# Patient Record
Sex: Female | Born: 1985 | Race: Black or African American | Hispanic: No | Marital: Single | Smoking: Never smoker
Health system: Southern US, Community
[De-identification: ages and names within clinical notes are randomized; demographics above are authoritative.]

## PROBLEM LIST (undated history)

## (undated) DIAGNOSIS — E079 Disorder of thyroid, unspecified: Secondary | ICD-10-CM

## (undated) DIAGNOSIS — C801 Malignant (primary) neoplasm, unspecified: Secondary | ICD-10-CM

## (undated) DIAGNOSIS — J45909 Unspecified asthma, uncomplicated: Secondary | ICD-10-CM

---
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
## 2005-03-12 ENCOUNTER — Ambulatory Visit (HOSPITAL_COMMUNITY): Admission: RE | Admit: 2005-03-12 | Discharge: 2005-03-12 | Payer: Self-pay | Admitting: *Deleted

## 2005-04-30 ENCOUNTER — Ambulatory Visit (HOSPITAL_COMMUNITY): Admission: AD | Admit: 2005-04-30 | Discharge: 2005-04-30 | Payer: Self-pay | Admitting: *Deleted

## 2005-08-19 ENCOUNTER — Ambulatory Visit: Payer: Self-pay | Admitting: *Deleted

## 2005-08-19 ENCOUNTER — Inpatient Hospital Stay (HOSPITAL_COMMUNITY): Admission: AD | Admit: 2005-08-19 | Discharge: 2005-08-23 | Payer: Self-pay | Admitting: *Deleted

## 2005-08-20 ENCOUNTER — Encounter (INDEPENDENT_AMBULATORY_CARE_PROVIDER_SITE_OTHER): Payer: Self-pay | Admitting: Specialist

## 2005-08-27 ENCOUNTER — Inpatient Hospital Stay (HOSPITAL_COMMUNITY): Admission: AD | Admit: 2005-08-27 | Discharge: 2005-08-27 | Payer: Self-pay | Admitting: Obstetrics and Gynecology

## 2005-10-26 IMAGING — CT CT PELVIS W/ CM
1 of 3 series · 14 of 32 positions shown, 19 images · IV contrast (OMNI 300 100)
Comparison: none

CLINICAL DATA: 18 year old with upper quadrant abdominal pain and pelvic pain with amenorrhea. 
CT ABDOMEN AND PELVIS WITH CONTRAST:
Helical CT examination of the abdomen and pelvis was performed after bolus infusion of a total of 100 cc of Omnipaque 300 and the use of diluted oral contrast. 
The lung bases are clear.  
CT ABDOMEN:
The liver, spleen, pancreas, adrenal glands and kidneys demonstrate no significant abnormalities.   The gallbladder appears normal.   The stomach is fairly well distended with contrast and demonstrates no gross abnormalities. The duodenum, small bowel and colon are unremarkable.   No mesenteric or retroperitoneal masses or adenopathy.   The aorta is normal in caliber.

[Series 2: abd pelvis · axial · 0.67mm/px · z∈[-407,-22]mm · 14 of 85 slices shown, 19 images]
[im 5/85  soft-tissue]
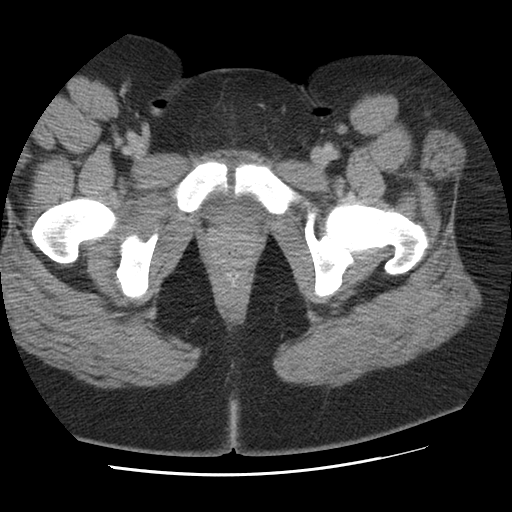
[im 5/85  bone]
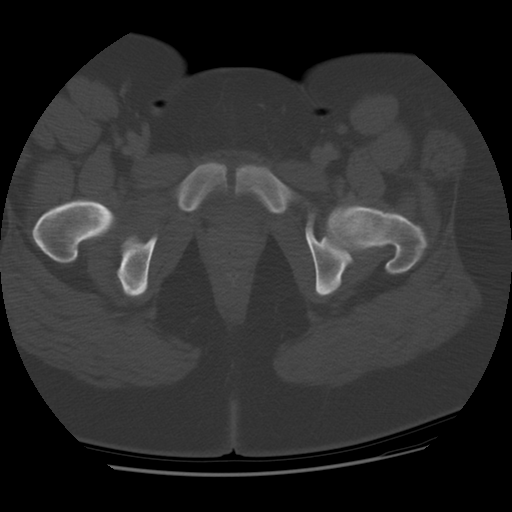
[im 13/85  soft-tissue]
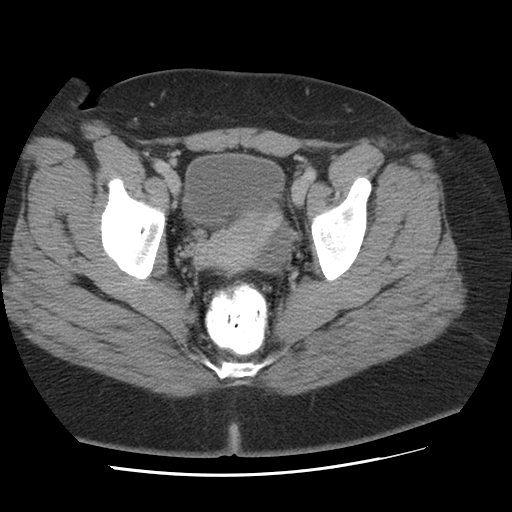
[im 17/85  soft-tissue]
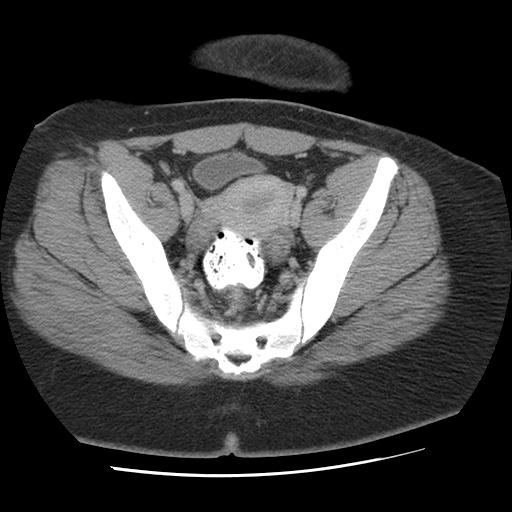
[im 26/85  soft-tissue]
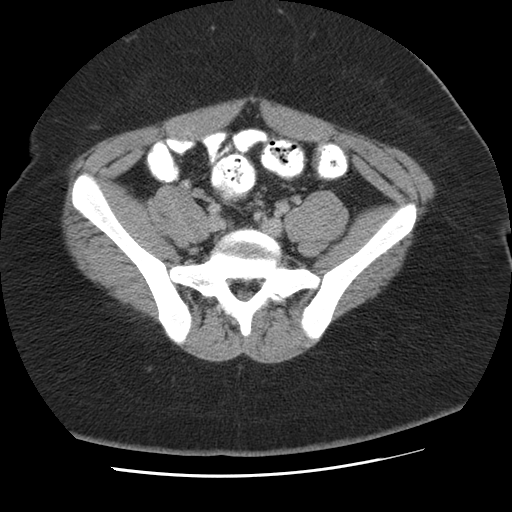
[im 30/85  soft-tissue]
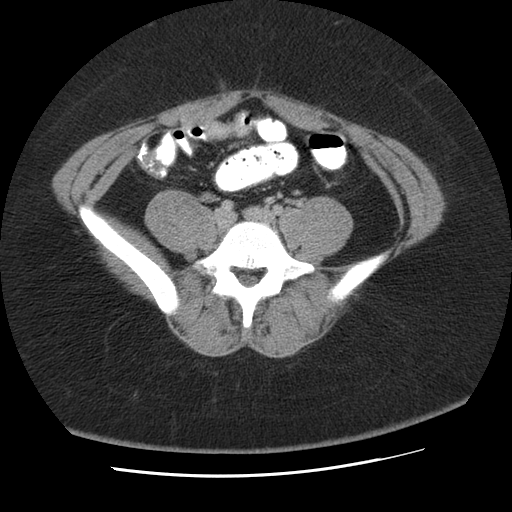
[im 38/85  soft-tissue]
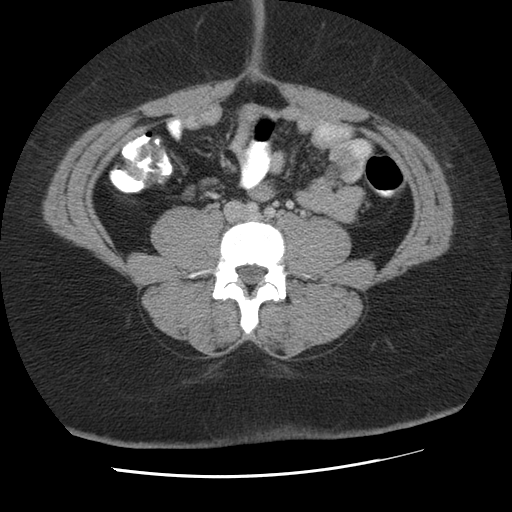
[im 43/85  soft-tissue]
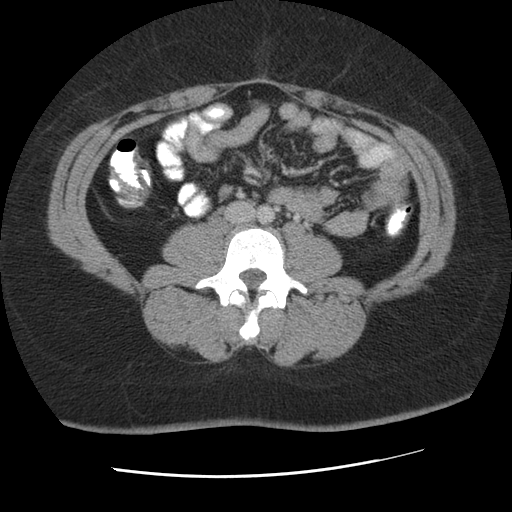
[im 47/85  soft-tissue]
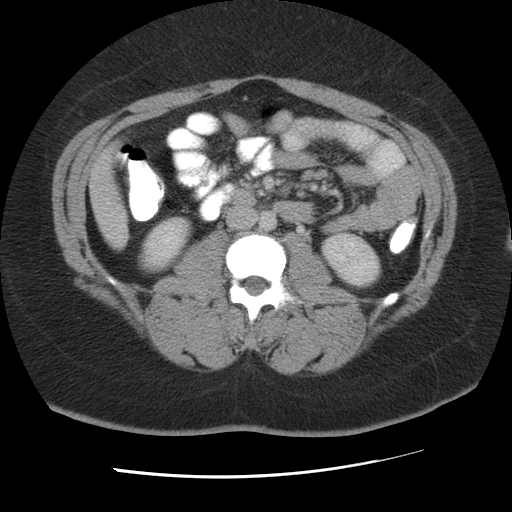
[im 55/85  soft-tissue]
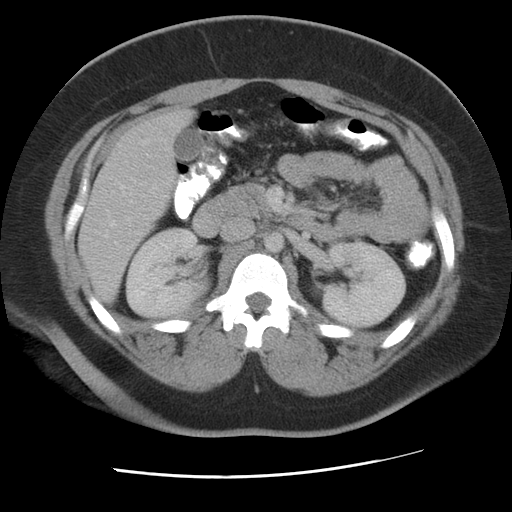
[im 55/85  bone]
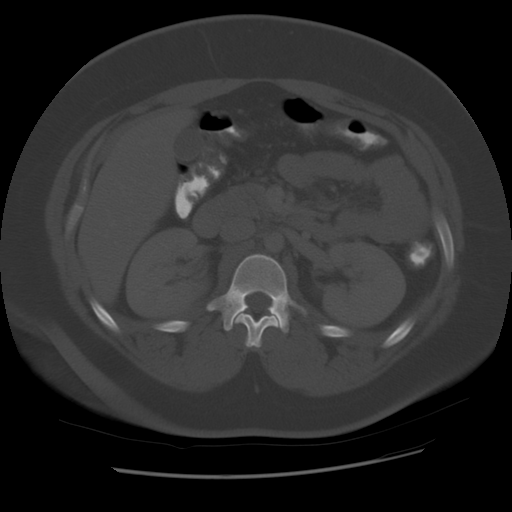
[im 59/85  soft-tissue]
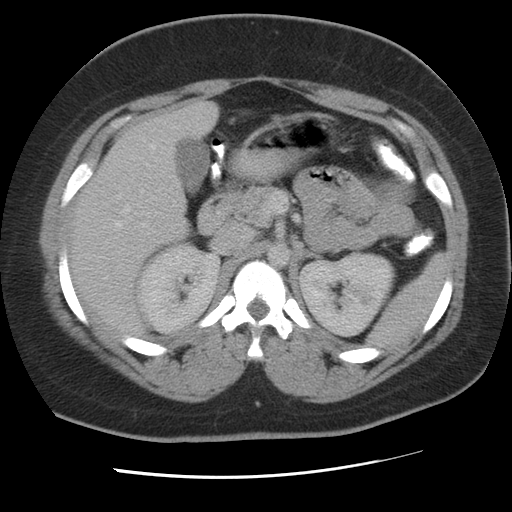
[im 68/85  soft-tissue]
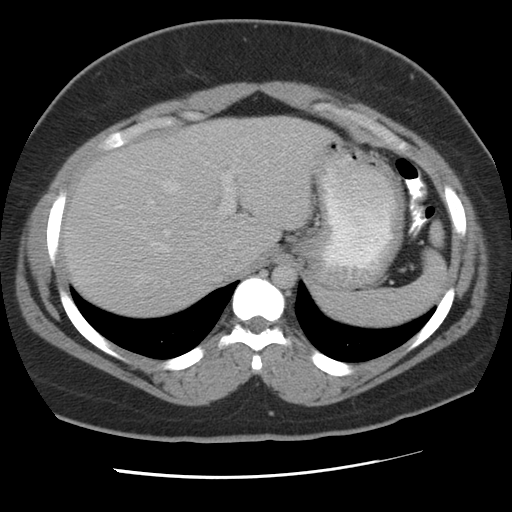
[im 68/85  lung]
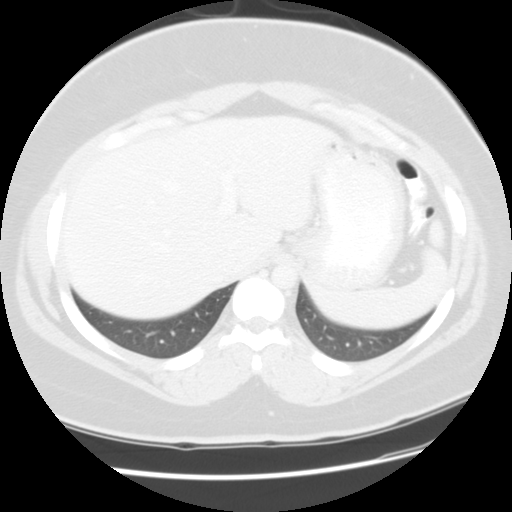
[im 72/85  soft-tissue]
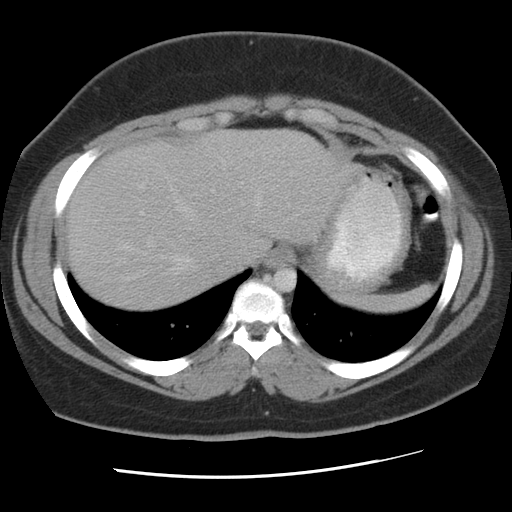
[im 72/85  lung]
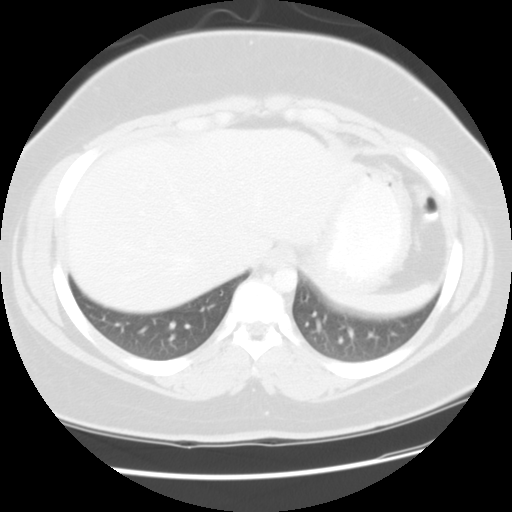
[im 76/85  lung]
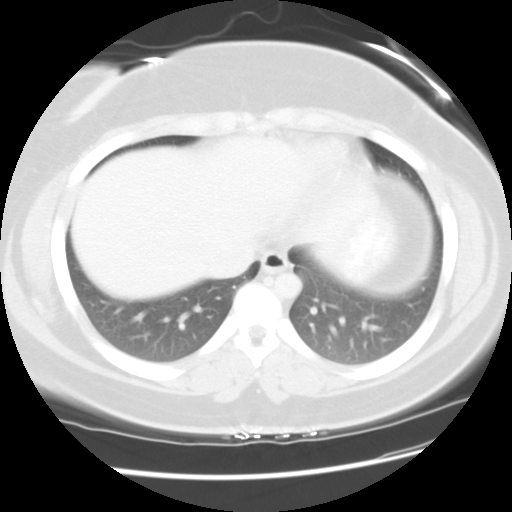
[im 80/85  soft-tissue]
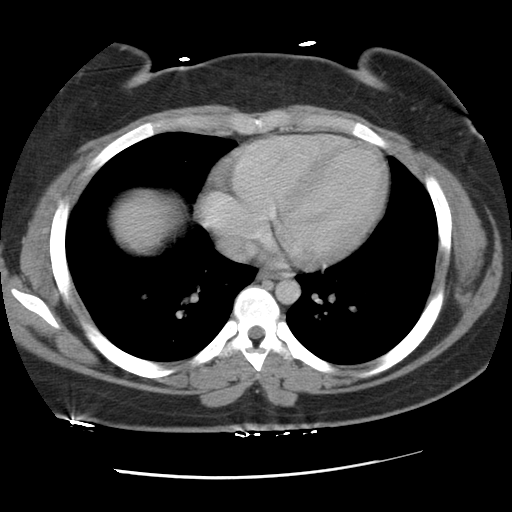
[im 80/85  lung]
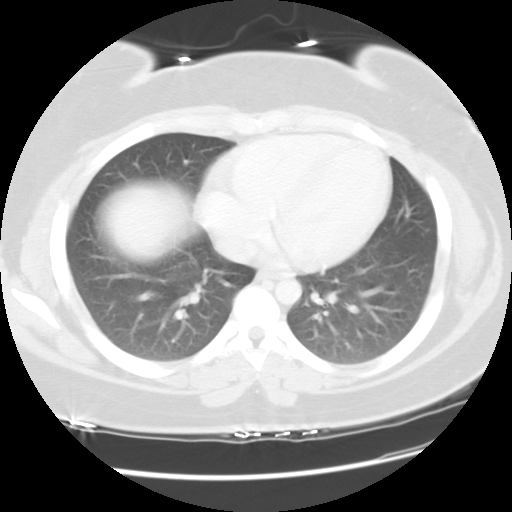

[14 of 32 positions shown; findings below may reference images not displayed]

IMPRESSION: Unremarkable CT abdomen. 
CT PELVIS:
The uterus and ovaries appear normal by CT.   There are small cysts associated with both ovaries, left greater than right. The rectum, sigmoid colon and visualized small bowel appear normal. The appendix is visualized and is normal in appearance.   A few small scattered perithecal lymph nodes.
IMPRESSION: 1.   Unremarkable pelvic CT.

## 2005-11-13 ENCOUNTER — Emergency Department (HOSPITAL_COMMUNITY): Admission: EM | Admit: 2005-11-13 | Discharge: 2005-11-13 | Payer: Self-pay | Admitting: Emergency Medicine

## 2005-11-17 IMAGING — US US OB COMP LESS 14 WK
1 series · 14 of 25 positions shown · non-contrast
Comparison: none

CLINICAL DATA: No prenatal care; abdominal pain; uncertain gestational age.

[Series 1: us ob comp less 14 wk · 0.29mm/px · 14 of 25 slices shown]
[im 1/25]
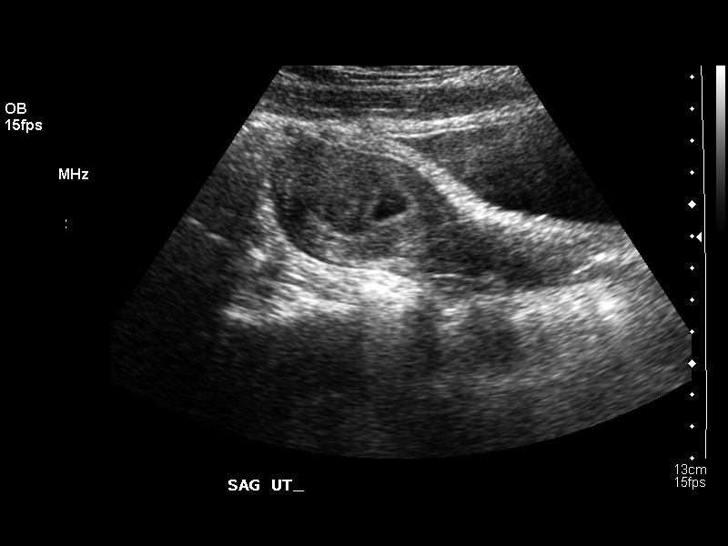
[im 3/25]
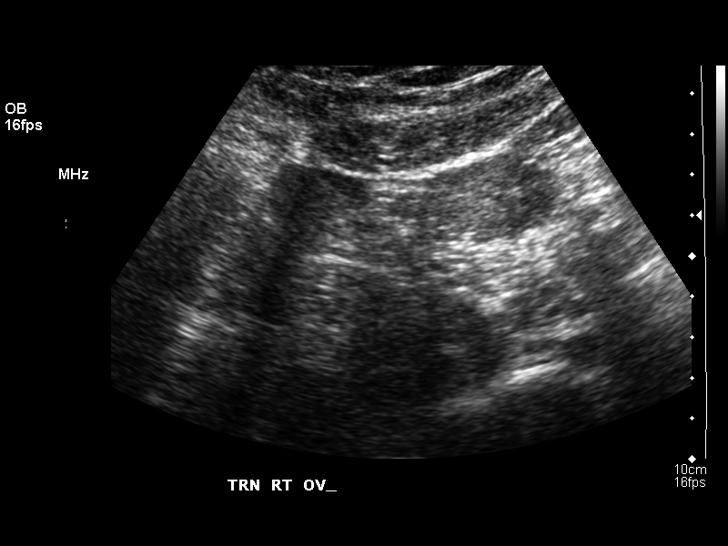
[im 5/25]
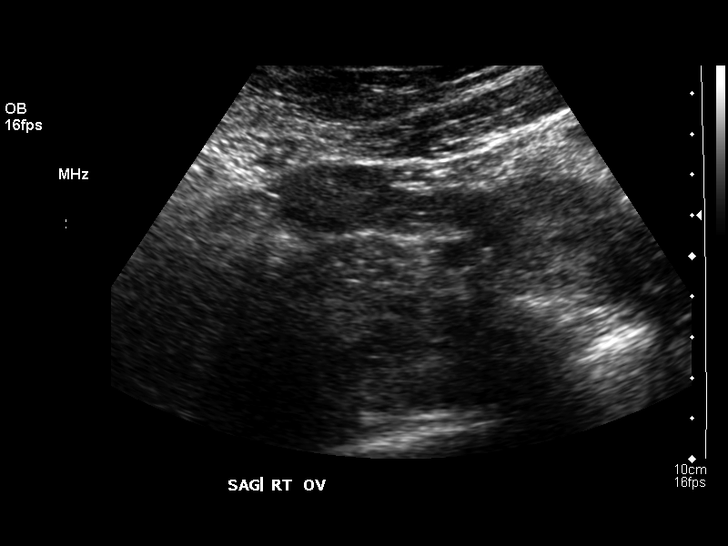
[im 7/25]
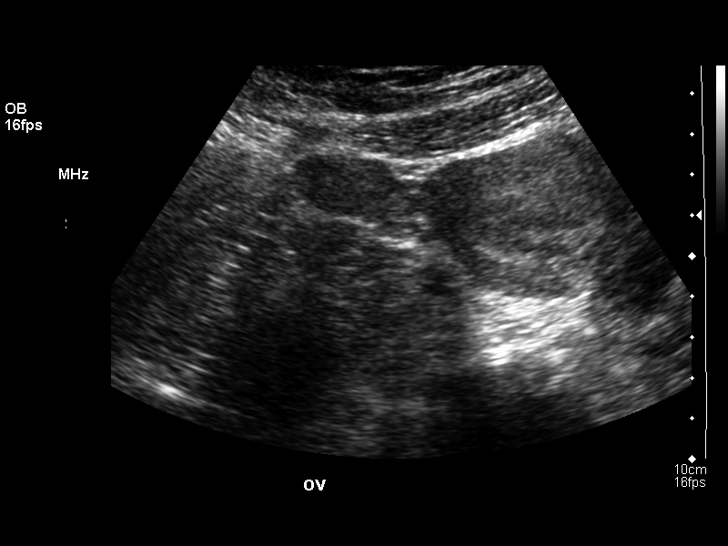
[im 9/25]
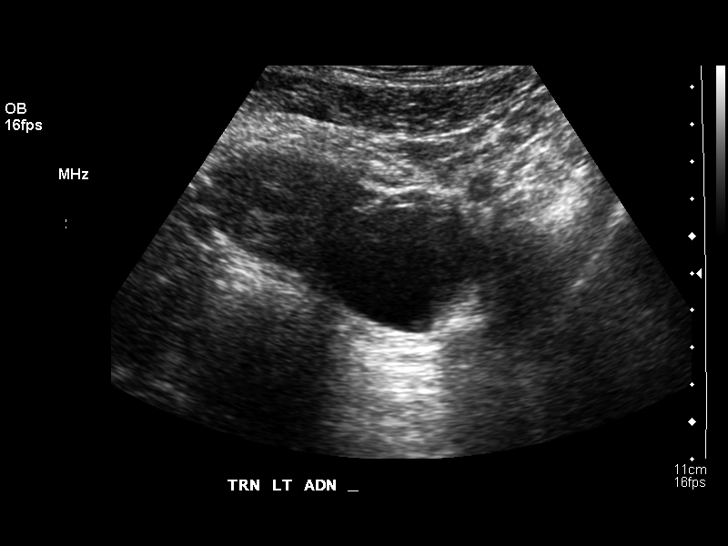
[im 10/25]
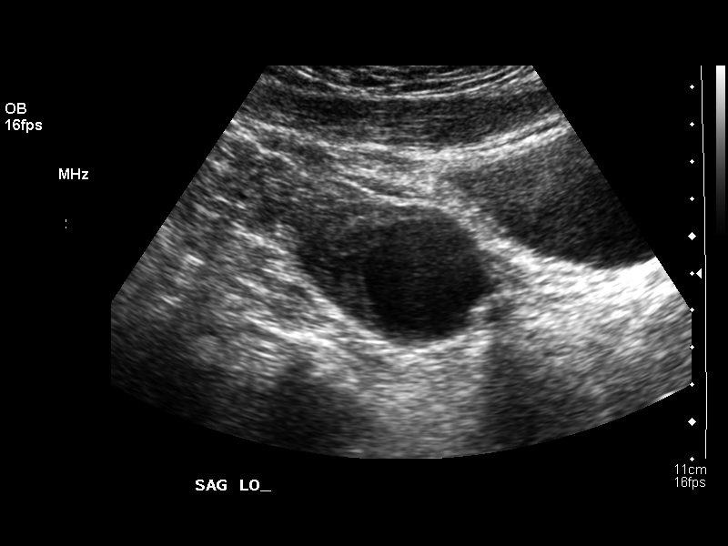
[im 12/25]
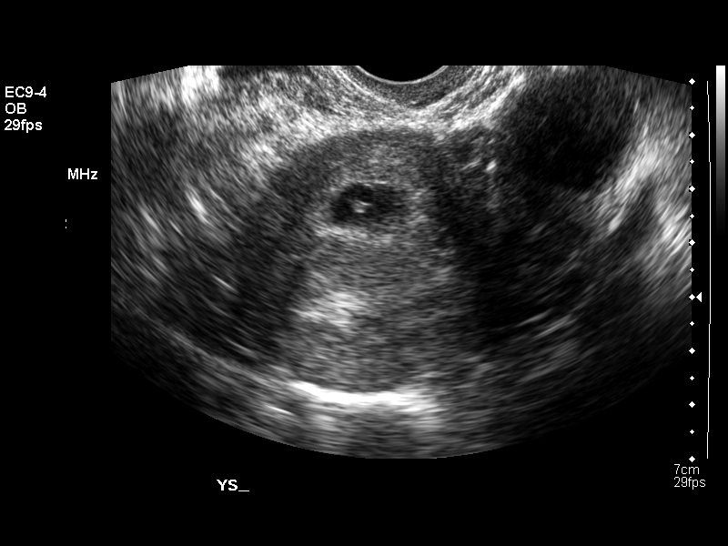
[im 14/25]
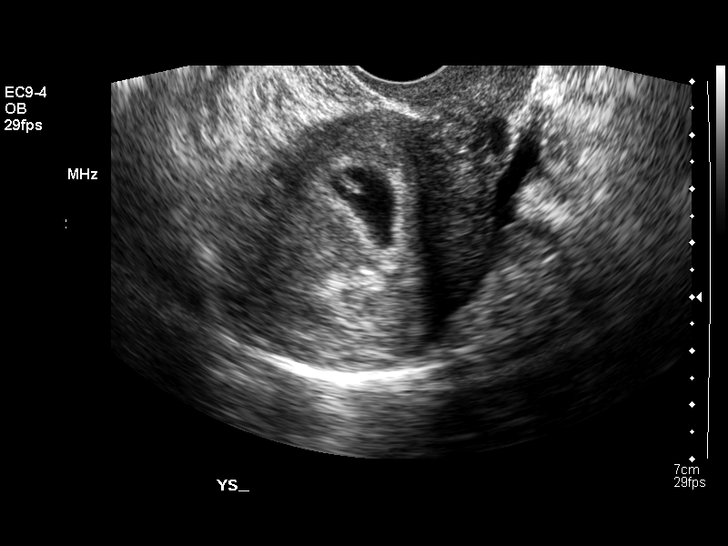
[im 16/25]
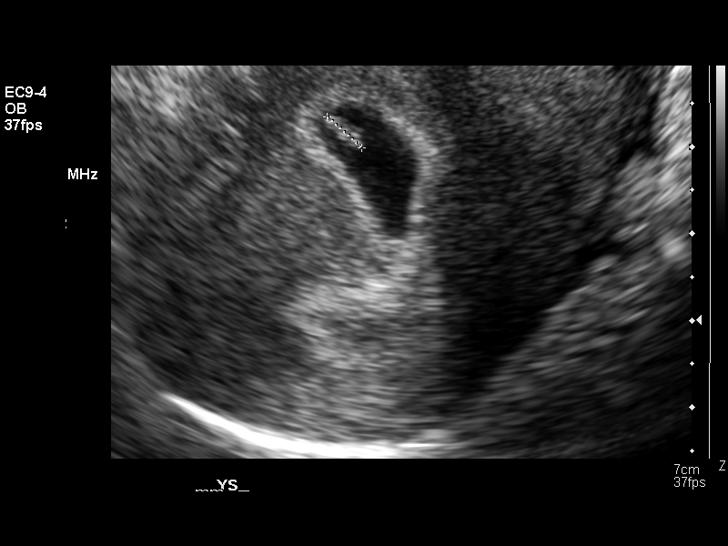
[im 17/25]
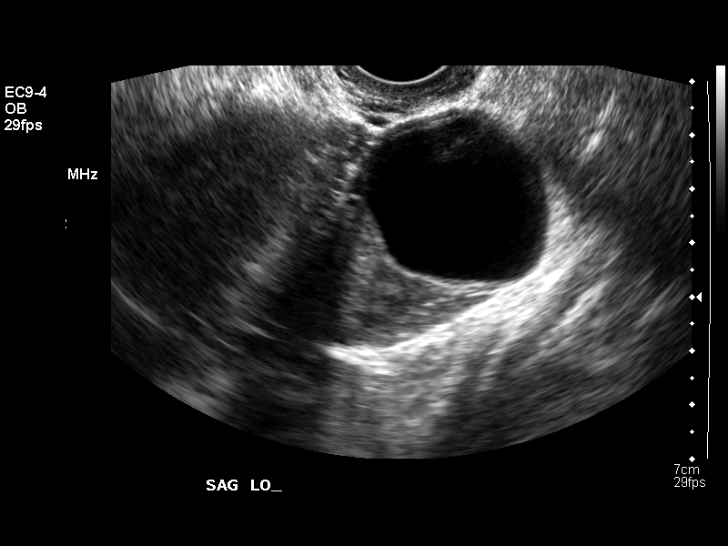
[im 19/25]
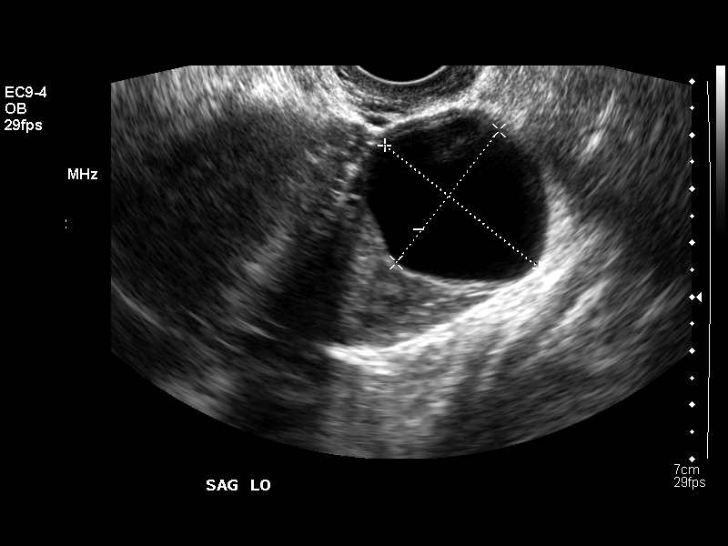
[im 21/25]
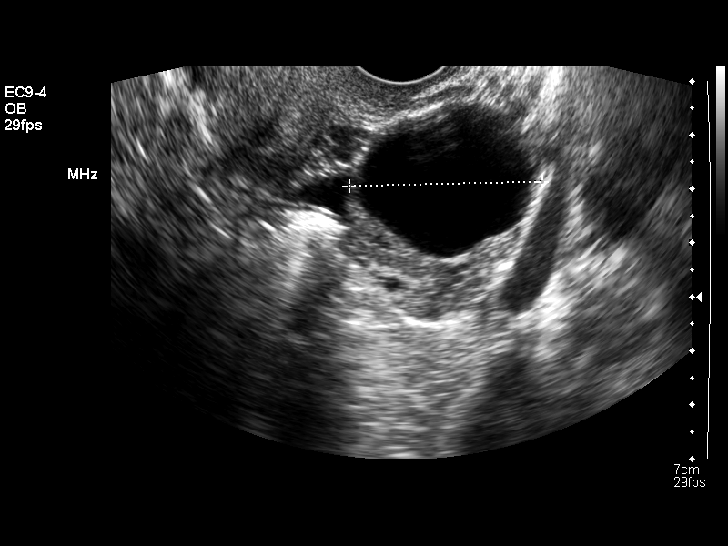
[im 23/25]
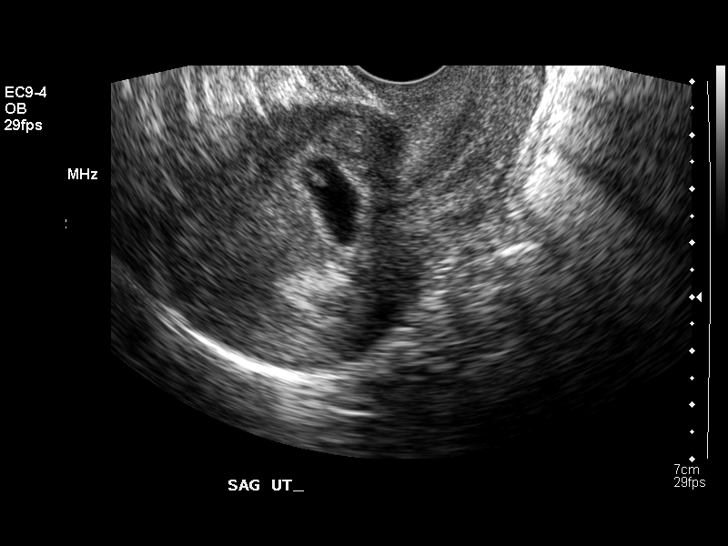
[im 25/25]
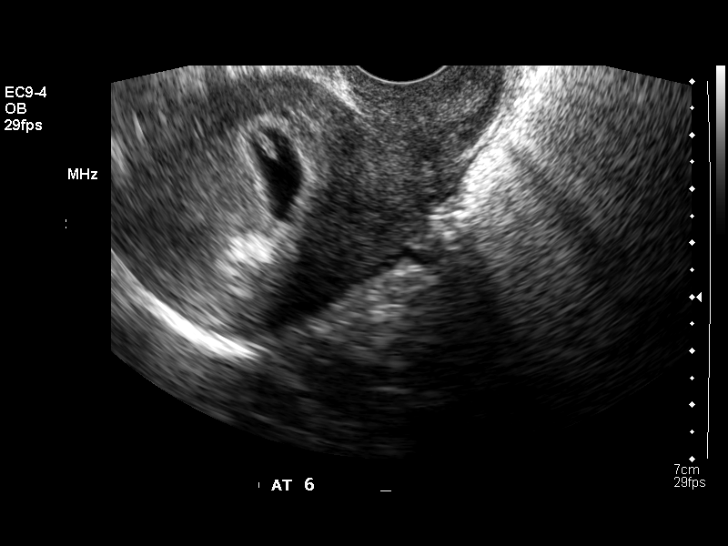

[14 of 25 positions shown; findings below may reference images not displayed]

OBSTETRICAL ULTRASOUND WITH TRANSVAGINAL:

 Number of Fetuses: 1
 Heart Rate:  120 bpm
 CRL:   .5 cm   6 w 2 d

 Ultrasound EDC:  08/15/05

 Fetal anatomy could not be evaluated due to the early gestational age.  Yolk sac visualized. 

 MATERNAL UTERINE AND ADNEXAL FINDINGS
 Cervix not evaluated.  Left ovarian corpus luteum measuring 3 cm.
IMPRESSION: There is a single living intrauterine gestation with a crown rump length indicating a gestational age of 6 weeks and 2 days and an EDC of 08/15/04.  Complete anatomy scan at 19 – 21 weeks is recommended.

## 2006-08-07 ENCOUNTER — Emergency Department (HOSPITAL_COMMUNITY): Admission: EM | Admit: 2006-08-07 | Discharge: 2006-08-07 | Payer: Self-pay | Admitting: Emergency Medicine

## 2006-09-06 ENCOUNTER — Ambulatory Visit: Payer: Self-pay | Admitting: Family Medicine

## 2007-07-30 ENCOUNTER — Ambulatory Visit: Payer: Self-pay | Admitting: Family Medicine

## 2008-09-29 ENCOUNTER — Emergency Department (HOSPITAL_COMMUNITY): Admission: EM | Admit: 2008-09-29 | Discharge: 2008-09-29 | Payer: Self-pay | Admitting: *Deleted

## 2010-08-07 ENCOUNTER — Ambulatory Visit (HOSPITAL_COMMUNITY): Admission: RE | Admit: 2010-08-07 | Discharge: 2010-08-07 | Payer: Self-pay | Admitting: Family Medicine

## 2010-09-25 ENCOUNTER — Ambulatory Visit (HOSPITAL_COMMUNITY)
Admission: RE | Admit: 2010-09-25 | Discharge: 2010-09-25 | Payer: Self-pay | Source: Home / Self Care | Admitting: Obstetrics & Gynecology

## 2011-05-18 NOTE — Discharge Summary (Signed)
Rebecca Brewer, Rebecca Brewer             ACCOUNT NO.:  [id number]   MEDICAL RECORD NO.:  [id number]          PATIENT TYPE:  INP   LOCATION:  9147                          FACILITY:  [facility]   PHYSICIAN:  Conni Elliot, M.D.DATE OF BIRTH:  06-13-1986   DATE OF ADMISSION:  08/19/2005  DATE OF DISCHARGE:                                 DISCHARGE SUMMARY   ADMITTING DIAGNOSES:  1.  Latent labor.  2.  Spontaneous rupture of membranes with light meconium.   DISCHARGE DIAGNOSIS:  Full-term delivery with primary lower transverse  cesarean section for arrest of active phase of labor.   PERTINENT LABORATORY DATA:  Rubella not immune. GBS positive. Blood type O  positive. Hemoglobin 7.1, hematocrit 21.4.   HOSPITAL COURSE:  This is a 25 year old G2 P1-0-1-1 who presented at 43 and  four-sevenths weeks gestation after having ruptured her membranes and having  light meconium. The patient ultimately required a cesarean section after  arrest of dilatation with Pitocin augmentation which was done under epidural  anesthesia. No complications. Estimated blood loss 800 mL. The baby's Apgars  were 8 and 9. The patient and infant tolerated the cesarean section well.  The patient was discharged home on hospital day #3.   DISCHARGE:  To home.   DIET:  Regular.   ACTIVITY:  Nothing in vagina for 6 weeks.   MEDICATIONS:  1.  Iron 325 mg p.o. t.i.d.  2.  Colace 100 mg p.o. t.i.d.  3.  Percocet 5/325 mg p.o. q.4-6h. p.r.n. pain.  4.  Yasmin one pill daily starting 2 weeks from Sunday.   DISCHARGE INSTRUCTIONS:  The patient is to have pelvic rest for 6 weeks.  Follow up at the [facility] on August 27, 2005 for staple removal and follow up at  the [facility] in 2 weeks for a postpartum visit and incision  check. The patient agreed and understood the above instructions.      Altamese Cabal, M.D.    ______________________________  Conni Elliot, M.D.    KS/MEDQ  D:  08/23/2005  T:   08/23/2005  Job:  [id number]

## 2011-05-18 NOTE — Op Note (Signed)
NAMEOLUKEMI, PANCHAL             ACCOUNT NO.:  [id number]   MEDICAL RECORD NO.:  [id number]          PATIENT TYPE:  INP   LOCATION:  9147                          FACILITY:  [facility]   PHYSICIAN:  Conni Elliot, M.D.DATE OF BIRTH:  Jan 28, 1986   DATE OF PROCEDURE:  08/20/2005  DATE OF DISCHARGE:                                 OPERATIVE REPORT   PREOPERATIVE DIAGNOSIS:  Arrest of active phase of labor.   POSTOPERATIVE DIAGNOSIS:  Arrest of active phase of labor.   OPERATION:  Low transverse cesarean.   SURGEON:  Conni Elliot, M.D. and Karoline Caldwell B. Childers, MD   ANESTHESIA:  Continuous lumbar epidural.   DESCRIPTION OF PROCEDURE:  After placing the patient under continuous lumbar  anesthesia, patient supine, left lateral tilt position receiving oxygen, the  abdomen was prepped and draped in sterile fashion. A low transverse  Pfannenstiel incision was made. An incision was made through skin and  subcutaneous fascia, rectus muscles separated in the midline, peritoneal  cavity, bladder flap created. A low transverse uterine incision was made,  baby delivered in a vertex presentation. The baby was DeLee suctioned,  __________ remainder of the body was delivered. There was a shoulder cord  that was easily reduced. The remainder of the body was delivered. The cord  was doubly clamped and cut and handed to neonatologist in attendance. The  placenta was delivered spontaneously. The uterus, bladder flap, anterior  peritoneum, fascia and subcutaneous skin were closed in standard fashion.  Estimated blood loss was approximately 800 mL.           ______________________________  Conni Elliot, M.D.     ASG/MEDQ  D:  08/20/2005  T:  08/20/2005  Job:  [id number]

## 2011-08-21 IMAGING — US US TRANSVAGINAL NON-OB
1 series · 14 of 25 positions shown · non-contrast
Comparison: 08/07/2010

CLINICAL DATA: Follow-up right ovarian cyst



[Series 1: us transvaginal non-ob · 0.12mm/px · 14 of 41 slices shown]
[im 1/41]
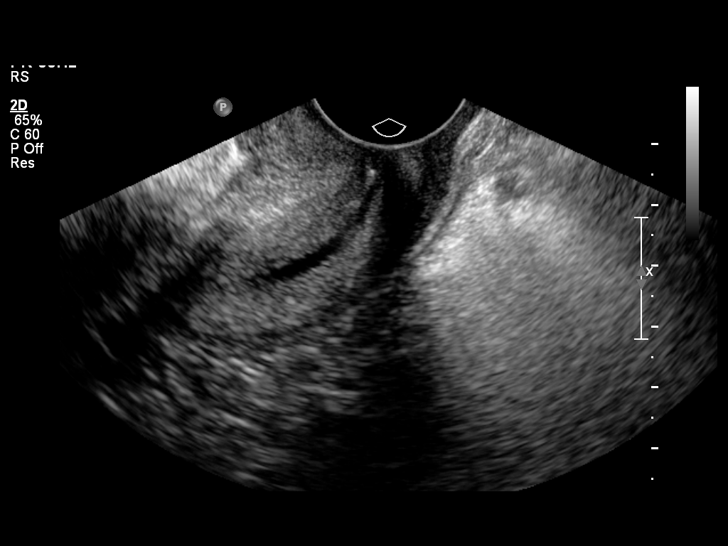
[im 4/41]
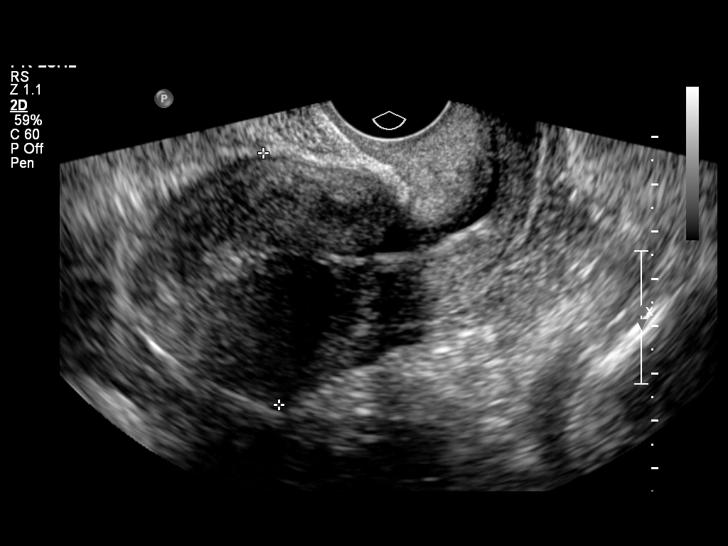
[im 7/41]
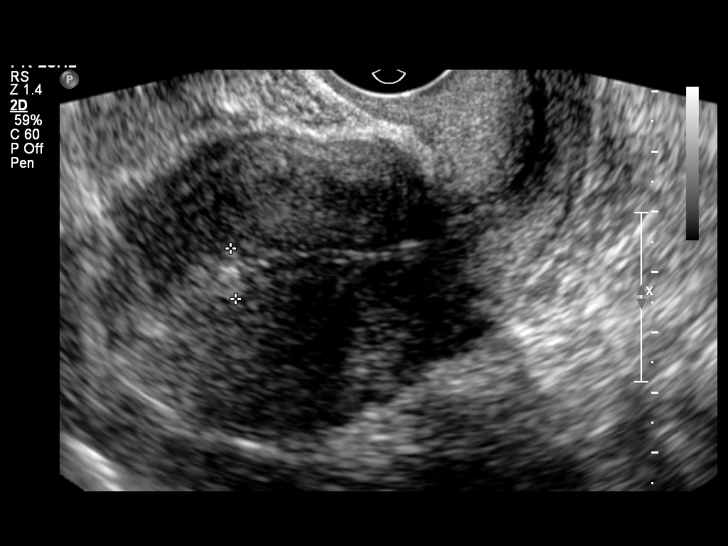
[im 11/41]
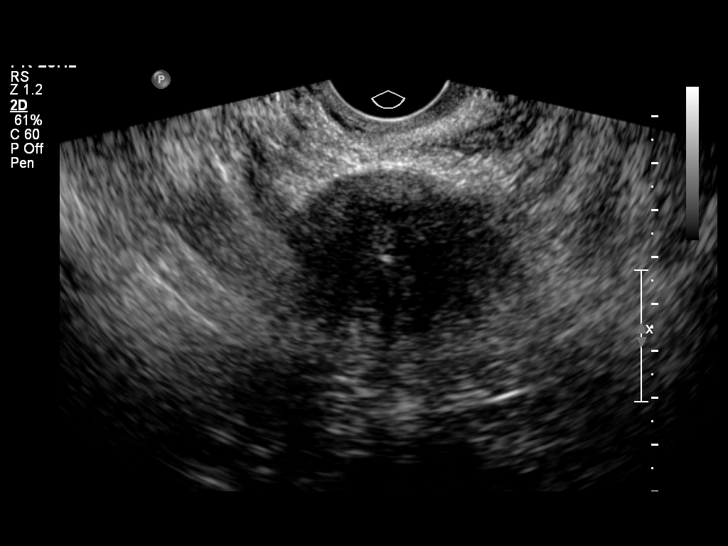
[im 14/41]
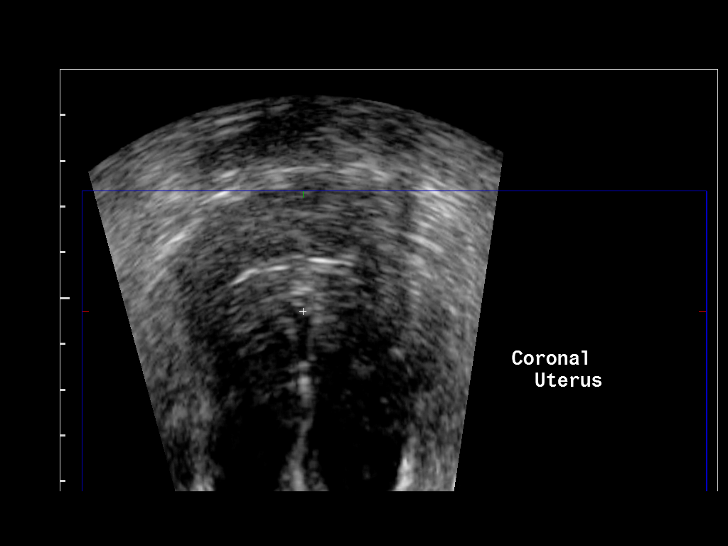
[im 16/41]
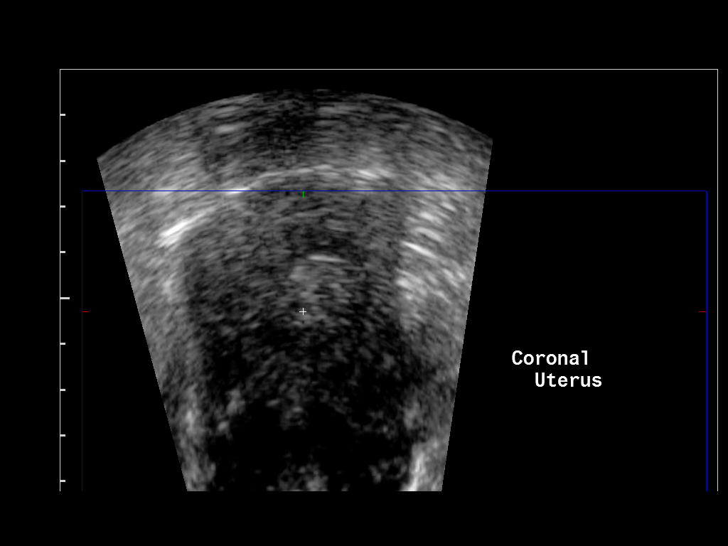
[im 19/41]
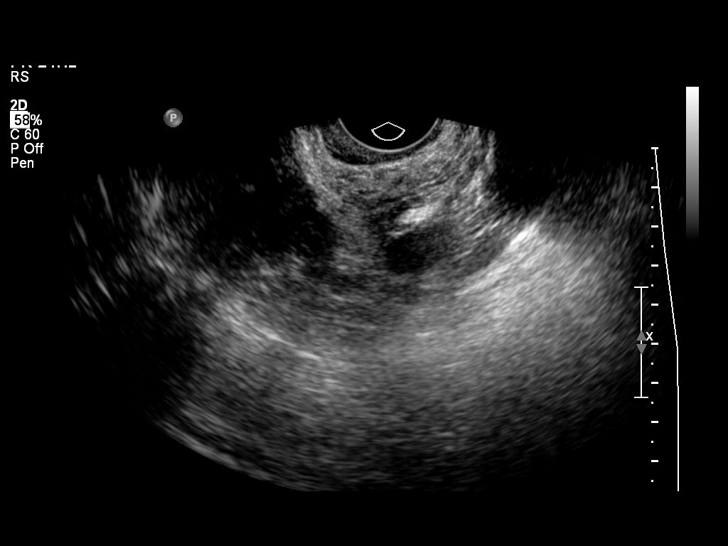
[im 22/41]
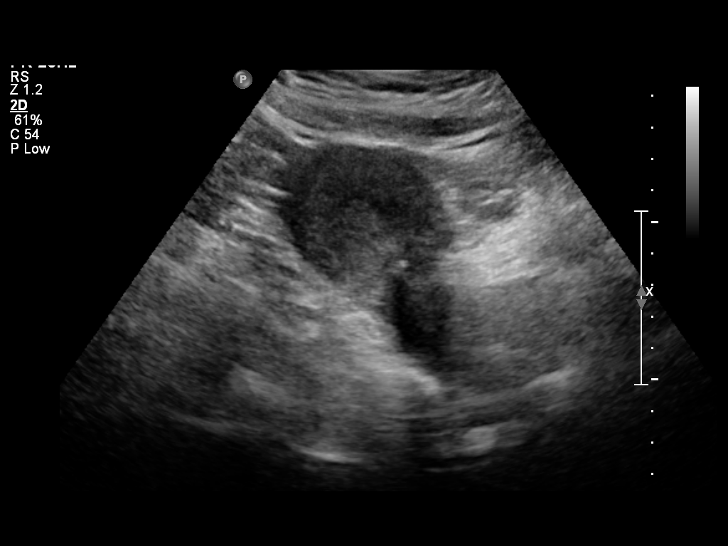
[im 26/41]
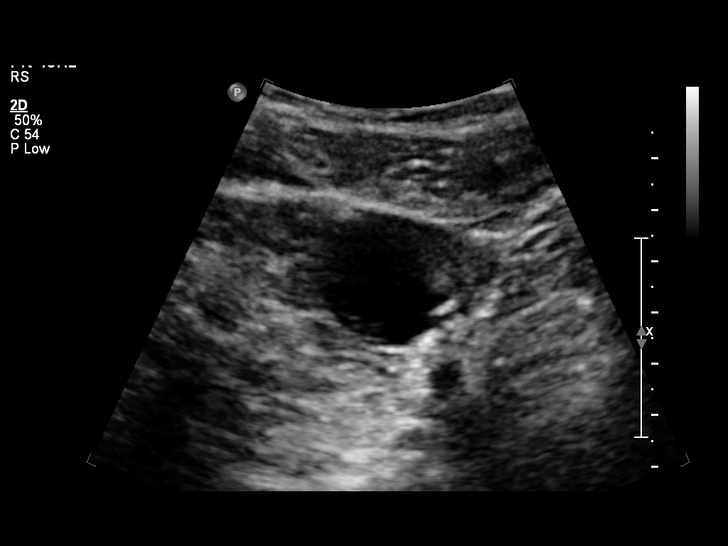
[im 27/41]
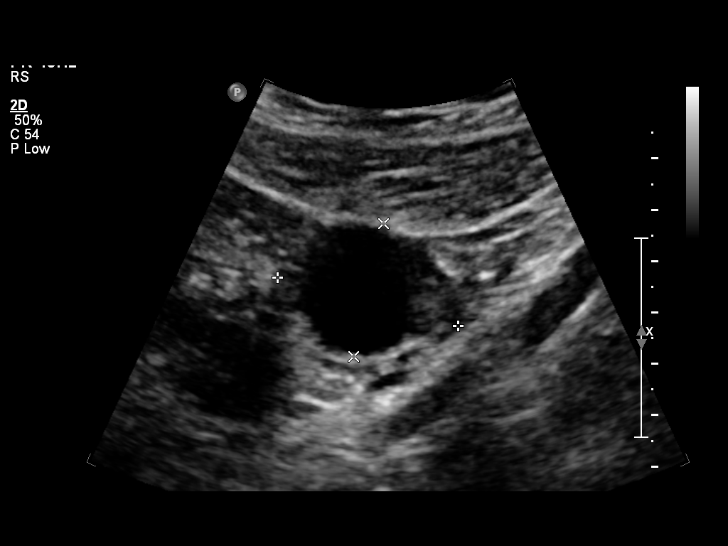
[im 31/41]
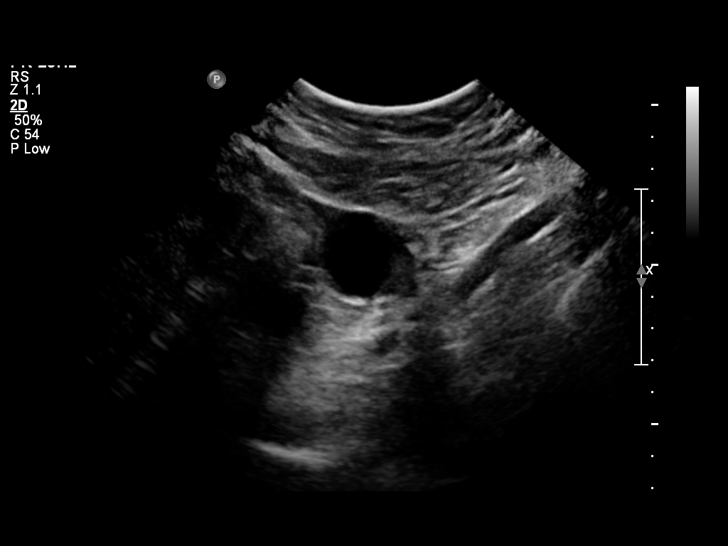
[im 34/41]
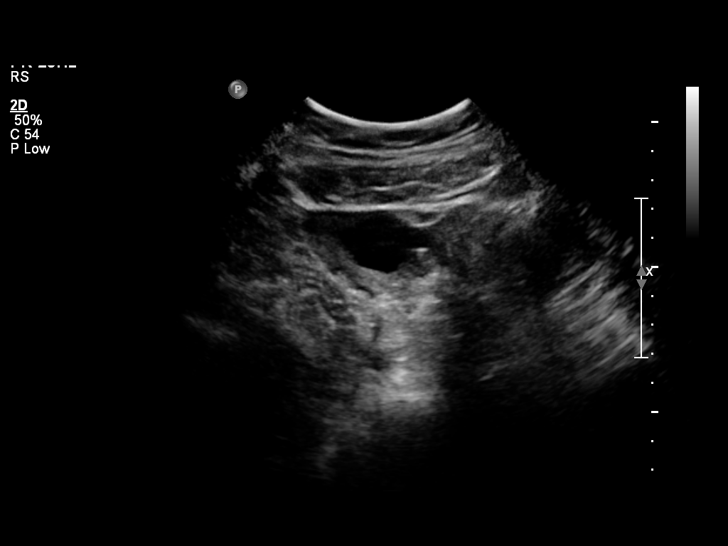
[im 37/41]
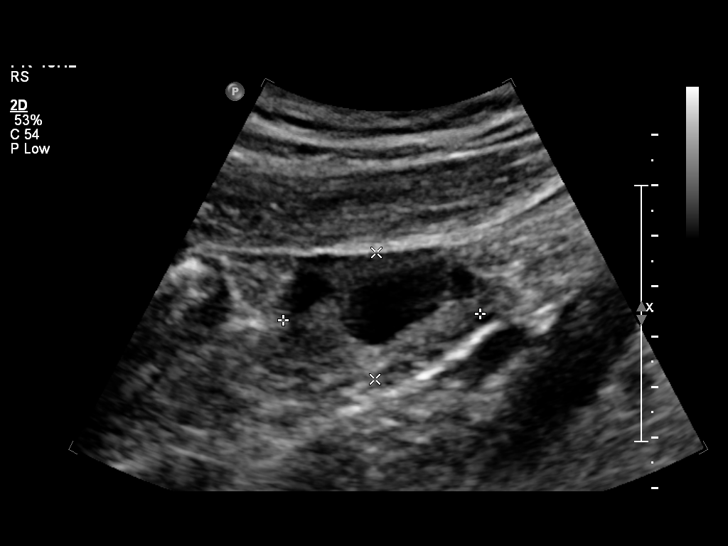
[im 41/41]
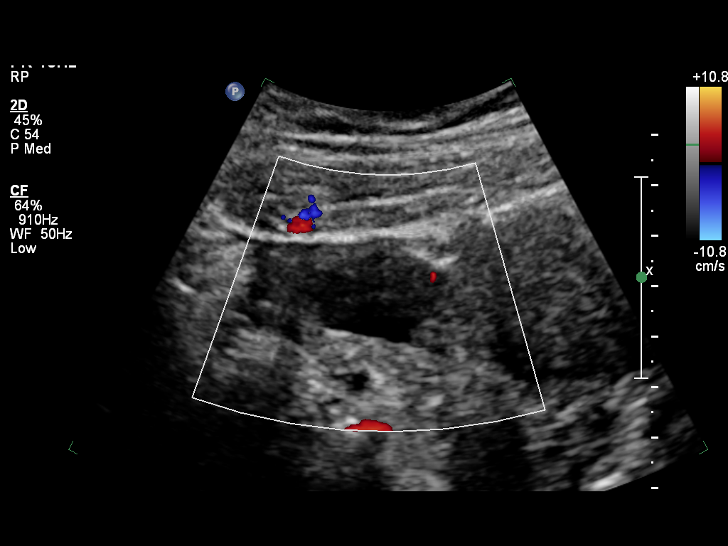

[14 of 25 positions shown; findings below may reference images not displayed]

FINDINGS: Uterus 9.8 x 5.3 x 4.9 cm.  Normal. IUD appropriately located.

Endometrium 0.8 cm.  Normal appearance where visualized, partly
obscured by IUD.

Right Ovary 3.9 x 4.0 x 2.5 cm.  Normal.

Left Ovary 3.6 x 3.2 x 2.7 cm.  Normal.

Other Findings:  No free fluid.
IMPRESSION: Normal exam.  IUD appropriately positioned.  Interval resolution of
previously seen cyst.

## 2011-09-27 ENCOUNTER — Emergency Department (HOSPITAL_COMMUNITY)
Admission: EM | Admit: 2011-09-27 | Discharge: 2011-09-27 | Disposition: A | Discharge status: Home / Self Care | Payer: Medicaid Other | Attending: Emergency Medicine | Admitting: Emergency Medicine

## 2011-09-27 DIAGNOSIS — R51 Headache: Secondary | ICD-10-CM | POA: Insufficient documentation

## 2014-09-13 ENCOUNTER — Emergency Department (HOSPITAL_COMMUNITY)
Admission: EM | Admit: 2014-09-13 | Discharge: 2014-09-13 | Disposition: A | Discharge status: Home / Self Care | Payer: Medicaid Other | Attending: Emergency Medicine | Admitting: Emergency Medicine

## 2014-09-13 ENCOUNTER — Encounter (HOSPITAL_COMMUNITY): Payer: Self-pay | Admitting: Emergency Medicine

## 2014-09-13 DIAGNOSIS — R630 Anorexia: Secondary | ICD-10-CM | POA: Insufficient documentation

## 2014-09-13 DIAGNOSIS — Z3202 Encounter for pregnancy test, result negative: Secondary | ICD-10-CM | POA: Insufficient documentation

## 2014-09-13 DIAGNOSIS — R1013 Epigastric pain: Secondary | ICD-10-CM | POA: Insufficient documentation

## 2014-09-13 DIAGNOSIS — R197 Diarrhea, unspecified: Secondary | ICD-10-CM | POA: Insufficient documentation

## 2014-09-13 DIAGNOSIS — R110 Nausea: Secondary | ICD-10-CM

## 2014-09-13 LAB — URINE MICROSCOPIC-ADD ON

## 2014-09-13 LAB — URINALYSIS, ROUTINE W REFLEX MICROSCOPIC
Bilirubin Urine: NEGATIVE
Glucose, UA: NEGATIVE mg/dL
KETONES UR: NEGATIVE mg/dL
Leukocytes, UA: NEGATIVE
NITRITE: NEGATIVE
Protein, ur: NEGATIVE mg/dL
Specific Gravity, Urine: 1.015 (ref 1.005–1.030)
Urobilinogen, UA: 0.2 mg/dL (ref 0.0–1.0)
pH: 6 (ref 5.0–8.0)

## 2014-09-13 LAB — PREGNANCY, URINE: Preg Test, Ur: NEGATIVE

## 2014-09-13 MED ORDER — OMEPRAZOLE 20 MG PO CPDR: 20.0000 mg | DELAYED_RELEASE_CAPSULE | Freq: Every day | ORAL | Status: DC

## 2014-09-13 MED ORDER — ONDANSETRON 4 MG PO TBDP: ORAL_TABLET | ORAL | Status: DC

## 2014-09-13 NOTE — ED Notes (Signed)
Pt states that she has had diarrhea since last Wednesday, denies N/V, denies blood in stool, states that when she bends over or starts laughing her head starts hurting and then goes away.

## 2014-09-13 NOTE — Discharge Instructions (Signed)
If you were given medicines take as directed.  If you are on coumadin or contraceptives realize their levels and effectiveness is altered by many different medicines.  If you have any reaction (rash, tongues swelling, other) to the medicines stop taking and see a physician.   Please follow up as directed and return to the ER or see a physician for new or worsening symptoms.  Thank you. Filed Vitals:   09/13/14 0802  BP: 115/80  Pulse: 96  Temp: 98 F (36.7 C)  TempSrc: Oral  Resp: 17  Height:  (1.549 m)  Weight: 182 lb (82.555 kg)  SpO2: 96%

## 2014-09-13 NOTE — ED Notes (Signed)
Pt aware that sample is needed for U/A 

## 2014-09-13 NOTE — ED Provider Notes (Signed)
CSN: [id number]     Arrival date & time 09/13/14  4098 History   First MD Initiated Contact with Patient 09/13/14 [phone]     Chief Complaint  Patient presents with  . Diarrhea     (Consider location/radiation/quality/duration/timing/severity/associated sxs/prior Treatment) HPI Comments: 28 year old female with no significant medical problems presents with diarrhea and nausea since Wednesday. 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 Patient tolerating liquids without difficulty. No urinary symptoms or abdominal pain. No bowel surgery history. No gallbladder problems. Mild burning sensation epigastric no formal reflux diagnoses the past. No right upper quadrant pain.  Patient is a 28 y.o. female presenting with diarrhea. The history is provided by the patient.  Diarrhea Associated symptoms: no abdominal pain, no chills, no fever, no headaches and no vomiting     History reviewed. No pertinent past medical history. History reviewed. No pertinent past surgical history. No family history on file. History  Substance Use Topics  . Smoking status: Never Smoker   . Smokeless tobacco: Not on file  . Alcohol Use: No   OB History   Grav Para Term Preterm Abortions TAB SAB Ect Mult Living                 Review of Systems  Constitutional: Positive for appetite change. Negative for fever and chills.  HENT: Negative for congestion.   Eyes: Negative for visual disturbance.  Respiratory: Negative for shortness of breath.   Cardiovascular: Negative for chest pain.  Gastrointestinal: Positive for diarrhea. Negative for vomiting and abdominal pain.  Genitourinary: Negative for dysuria and flank pain.  Musculoskeletal: Negative for back pain, neck pain and neck stiffness.  Skin: Negative for rash.  Neurological: Negative for light-headedness and headaches.      Allergies   Review of patient's allergies indicates no known allergies.  Home Medications   Prior to Admission medications   Not on File   BP 115/80  Pulse 96  Temp(Src) 98 F (36.7 C) (Oral)  Resp 17  Ht  (1.549 m)  Wt 182 lb (82.555 kg)  BMI 34.41 kg/m2  SpO2 96% Physical Exam  Nursing note and vitals reviewed. Constitutional: She is oriented to person, place, and time. She appears well-developed and well-nourished.  HENT:  Head: Normocephalic and atraumatic.  Eyes: Conjunctivae are normal. Right eye exhibits no discharge. Left eye exhibits no discharge.  Neck: Normal range of motion. Neck supple. No tracheal deviation present.  Cardiovascular: Normal rate and regular rhythm.   Pulmonary/Chest: Effort normal and breath sounds normal.  Abdominal: Soft. She exhibits no distension. There is no tenderness. There is no guarding.  Musculoskeletal: She exhibits no edema.  Neurological: She is alert and oriented to person, place, and time.  Skin: Skin is warm. No rash noted.  Psychiatric: She has a normal mood and affect.    ED Course  Procedures (including critical care time) Labs Review Labs Reviewed  URINALYSIS, ROUTINE W REFLEX MICROSCOPIC - Abnormal; Notable for the following:    APPearance HAZY (*)    Hgb urine dipstick SMALL (*)    All other components within normal limits  URINE MICROSCOPIC-ADD ON - Abnormal; Notable for the following:    Squamous Epithelial / LPF MANY (*)    All other components within normal limits  PREGNANCY, URINE    Imaging Review No results found.   EKG Interpretation None  MDM   Final diagnoses:  Diarrhea  Nausea  Epigastric burning sensation   Well-appearing patient with diarrhea and nausea. No focal abdominal pain. Patient is not require imaging or blood work at this time. Plan for reflux medications, nausea meds and followup outpatient.  Patient well-appearing in ER no pain, patient is not on any medicines at this time.  Discussed acid suppression and antiemetics as needed with outpatient followup. Results and differential diagnosis were discussed with the patient/parent/guardian. Close follow up outpatient was discussed, comfortable with the plan.   Medications - No data to display  Filed Vitals:   09/13/14 0802 09/13/14 0830  BP: 115/80 116/65  Pulse: 96 79  Temp: 98 F (36.7 C)   TempSrc: Oral   Resp: 17   Height:  (1.549 m)   Weight: 182 lb (82.555 kg)   SpO2: 96% 98%          Enid Skeens, MD 09/13/14 [phone]

## 2015-08-22 ENCOUNTER — Emergency Department (INDEPENDENT_AMBULATORY_CARE_PROVIDER_SITE_OTHER)
Admission: EM | Admit: 2015-08-22 | Discharge: 2015-08-22 | Disposition: A | Discharge status: Home / Self Care | Payer: Self-pay | Source: Home / Self Care | Attending: Emergency Medicine | Admitting: Emergency Medicine

## 2015-08-22 ENCOUNTER — Encounter (HOSPITAL_COMMUNITY): Payer: Self-pay | Admitting: Emergency Medicine

## 2015-08-22 DIAGNOSIS — H109 Unspecified conjunctivitis: Secondary | ICD-10-CM

## 2015-08-22 MED ORDER — POLYMYXIN B-TRIMETHOPRIM 10000-0.1 UNIT/ML-% OP SOLN: 1.0000 [drp] | OPHTHALMIC | Status: DC

## 2015-08-22 NOTE — Discharge Instructions (Signed)
You have conjunctivitis. Use the eyedrops every 4 hours for the next 7 days. If things are not improving by Wednesday or are getting worse, please call the eye doctor.

## 2015-08-22 NOTE — ED Notes (Signed)
Pt is having burning, itching and d/c from both eyes, the left eye being the worst.

## 2015-08-22 NOTE — ED Provider Notes (Signed)
CSN: [id number]     Arrival date & time 08/22/15  1304 History   First MD Initiated Contact with Patient 08/22/15 1357     Chief Complaint  Patient presents with  . Conjunctivitis   (Consider location/radiation/quality/duration/timing/severity/associated sxs/prior Treatment) HPI  She is a 29 year old woman here for evaluation of red eyes. She states it started in her left eye 5 days ago. Over the weekend she started noticing redness in her right eye. She reports clear to mucoid drainage from both eyes. She states her eyes burn a little bit. The left eye will throb some when she bends forward. The left eye is slightly sensitive to light. She denies any change in her vision. No headaches or fever. She does not wear contacts.  No past medical history on file. No past surgical history on file. No family history on file. Social History  Substance Use Topics  . Smoking status: Never Smoker   . Smokeless tobacco: Not on file  . Alcohol Use: No   OB History    No data available     Review of Systems As in history of present illness Allergies  Review of patient's allergies indicates no known allergies.  Home Medications   Prior to Admission medications   Medication Sig Start Date End Date Taking? Authorizing Provider  omeprazole (PRILOSEC) 20 MG capsule Take 1 capsule (20 mg total) by mouth daily. 09/13/14   Blane Ohara, MD  ondansetron (ZOFRAN ODT) 4 MG disintegrating tablet  ODT q4 hours prn nausea/vomit 09/13/14   Blane Ohara, MD  trimethoprim-polymyxin b (POLYTRIM) ophthalmic solution Place 1 drop into both eyes every 4 (four) hours. For 7 days 08/22/15   Charm Rings, MD   BP 129/86 mmHg  Pulse 89  Temp(Src) 98.7 F (37.1 C) (Oral)  Resp 16  SpO2 95% Physical Exam  Constitutional: She is oriented to person, place, and time. She appears well-developed and well-nourished. No distress.  Eyes: EOM are normal. Pupils are equal, round, and reactive to light. Right eye exhibits  discharge. Left eye exhibits discharge.  Bilateral conjunctiva are injected, left worse than right.  Neck: Neck supple.  Cardiovascular: Normal rate.   Pulmonary/Chest: Effort normal.  Neurological: She is oriented to person, place, and time.    ED Course  Procedures (including critical care time) Labs Review Labs Reviewed - No data to display  Imaging Review No results found.   MDM   1. Bilateral conjunctivitis    Treat with Polytrim eyedrops. If no improvement in 2 days or symptoms worsen, she will follow-up with Dr. Cathey Endow, eye doctor on-call.    Charm Rings, MD 08/22/15 1425

## 2015-08-25 NOTE — ED Notes (Signed)
Patient called, c/o here eyes are getting worse instead of better. States she did not get any better after using Rx as written. Has not called MD on her instruction papers for recheck. Was advised to call them and make an appointment, or to come here fpr recheck. Mumbled an unrepeatable explicative , and thanked me, then hung up.

## 2016-04-05 ENCOUNTER — Encounter: Payer: Self-pay | Admitting: *Deleted

## 2016-04-18 ENCOUNTER — Encounter: Payer: Self-pay | Admitting: Obstetrics & Gynecology

## 2016-04-18 ENCOUNTER — Other Ambulatory Visit (HOSPITAL_COMMUNITY)
Admission: RE | Admit: 2016-04-18 | Discharge: 2016-04-18 | Disposition: A | Discharge status: Home / Self Care | Payer: Medicaid Other | Source: Ambulatory Visit | Attending: Obstetrics & Gynecology | Admitting: Obstetrics & Gynecology

## 2016-04-18 ENCOUNTER — Encounter: Payer: Self-pay | Admitting: *Deleted

## 2016-04-18 ENCOUNTER — Ambulatory Visit (INDEPENDENT_AMBULATORY_CARE_PROVIDER_SITE_OTHER): Payer: Medicaid Other | Admitting: Obstetrics & Gynecology

## 2016-04-18 VITALS — BP 136/88 | HR 109 | Resp 16 | Ht 61.0 in | Wt 309.0 lb

## 2016-04-18 DIAGNOSIS — Z01419 Encounter for gynecological examination (general) (routine) without abnormal findings: Secondary | ICD-10-CM | POA: Insufficient documentation

## 2016-04-18 DIAGNOSIS — Z30433 Encounter for removal and reinsertion of intrauterine contraceptive device: Secondary | ICD-10-CM

## 2016-04-18 DIAGNOSIS — Z124 Encounter for screening for malignant neoplasm of cervix: Secondary | ICD-10-CM

## 2016-04-18 MED ORDER — LEVONORGESTREL 20 MCG/24HR IU IUD
INTRAUTERINE_SYSTEM | Freq: Once | INTRAUTERINE | Status: AC
  Administered 2016-04-18: 1 via INTRAUTERINE

## 2016-04-18 NOTE — Patient Instructions (Signed)

## 2016-04-18 NOTE — Progress Notes (Signed)
    GYNECOLOGY CLINIC PROCEDURE NOTE  Warnell ForesterChelsea Crittendon is a 30 y.o. [phone] here for Mirena IUD removal and reinsertion; [facility] as they were unable to do pap and procedures there due to her habitus in 11/2014. No GYN concerns.  Last pap smear was in 2014 and was normal.  IUD Removal and Reinsertion  Patient identified, informed consent performed, consent signed.   Discussed risks of irregular bleeding, cramping, infection, malpositioning or misplacement of the IUD outside the uterus which may require further procedures. Also advised to use backup contraception for one week as the risk of pregnancy is higher during the transition period of removing an IUD and replacing it with another one. Time out was performed. Speculum placed in the vagina. The cervix was visualized, no lesions, no abnormal discharge.Pap smear was done. The cervix was then cleaned with Betadine x 2 and grasped anteriorly with a single tooth tenaculum.  The strings of the IUD were not visualized, so Kelly forceps were introduced into the cervical canal the IUD strings were grasped. The IUD was then removed in its entirety.The cervix was re-swabbed with Betadine, and the new Mirena IUD insertion apparatus was used to sound the uterus to 10 cm;  the IUD was then placed per manufacturer's recommendations. Strings trimmed to 3 cm. Tenaculum was removed, good hemostasis noted. Bedside ultrasound showed IUD to be in correct intrauterine position. Patient tolerated procedure well.   Patient was given post-procedure instructions.  She was reminded to have backup contraception for one week during this transition period between IUDs.  Patient was also to follow up in 4 weeks for IUD check.   Jaynie CollinsUGONNA  Karrie Fluellen, MD, FACOG Attending Obstetrician & Gynecologist, [facility]

## 2016-04-19 LAB — CYTOLOGY - PAP

## 2018-06-13 ENCOUNTER — Ambulatory Visit (HOSPITAL_COMMUNITY)
Admission: EM | Admit: 2018-06-13 | Discharge: 2018-06-13 | Disposition: A | Discharge status: Home / Self Care | Payer: Self-pay | Attending: Family Medicine | Admitting: Family Medicine

## 2018-06-13 ENCOUNTER — Encounter (HOSPITAL_COMMUNITY): Payer: Self-pay | Admitting: Family Medicine

## 2018-06-13 DIAGNOSIS — J029 Acute pharyngitis, unspecified: Secondary | ICD-10-CM | POA: Insufficient documentation

## 2018-06-13 LAB — POCT INFECTIOUS MONO SCREEN: Mono Screen: NEGATIVE

## 2018-06-13 LAB — POCT RAPID STREP A: Streptococcus, Group A Screen (Direct): NEGATIVE

## 2018-06-13 MED ORDER — PREDNISONE 50 MG PO TABS: 50.0000 mg | ORAL_TABLET | Freq: Every day | ORAL | 0 refills | Status: AC

## 2018-06-13 MED ORDER — RANITIDINE HCL 150 MG PO TABS: 150.0000 mg | ORAL_TABLET | Freq: Two times a day (BID) | ORAL | 0 refills | Status: DC

## 2018-06-13 MED ORDER — CETIRIZINE HCL 10 MG PO CAPS: 10.0000 mg | ORAL_CAPSULE | Freq: Every day | ORAL | 0 refills | Status: DC

## 2018-06-13 NOTE — ED Triage Notes (Signed)
Pt here for sore throat x 2 weeks. She sts that when she swallows the pain radiates into chest. No fever

## 2018-06-13 NOTE — ED Provider Notes (Signed)
[facility]    CSN: [id number] Arrival date & time: 06/13/18  1230     History   Chief Complaint Chief Complaint  Patient presents with  . Sore Throat    HPI Rebecca Brewer is a 32 y.o. female no significant past medical history presenting today for evaluation of sore throat.  Patient states that she has had a sore throat off and on for the past 2 weeks.  More recently this throat pain has worsened, and she swallows the pain radiates into her chest and does have a burning sensation.  Denies history of reflux.  Denies fevers.  Denies associated coughing or congestion/rhinorrhea.  She has been trying throat spray, hot tea without relief.  She is going on a cruise in a week and a half and is wanting to be better by then.  HPI  History reviewed. No pertinent past medical history.  There are no active problems to display for this patient.   Past Surgical History:  Procedure Laterality Date  . CESAREAN SECTION      OB History    Gravida  3   Para  1   Term  1   Preterm      AB  2   Living  1     SAB      TAB  2   Ectopic      Multiple      Live Births  1            Home Medications    Prior to Admission medications   Medication Sig Start Date End Date Taking? Authorizing Provider  Cetirizine HCl 10 MG CAPS Take 1 capsule (10 mg total) by mouth daily for 15 days. 06/13/18 06/28/18  Dianca Owensby C, PA-C  predniSONE (DELTASONE) 50 MG tablet Take 1 tablet (50 mg total) by mouth daily for 5 days. 06/13/18 06/18/18  Chapel Silverthorn C, PA-C  ranitidine (ZANTAC) 150 MG tablet Take 1 tablet (150 mg total) by mouth 2 (two) times daily. 06/13/18   Ledford Goodson, Junius Creamer, PA-C    Family History Family History  Problem Relation Age of Onset  . Hypertension Mother   . Diabetes Mother   . Breast cancer Mother     Social History Social History   Tobacco Use  . Smoking status: Never Smoker  Substance Use Topics  . Alcohol use: Yes    Alcohol/week:  0.0 oz    Comment: occasional  . Drug use: No     Allergies   Patient has no known allergies.   Review of Systems Review of Systems  Constitutional: Negative for chills, fatigue and fever.  HENT: Positive for sore throat. Negative for congestion, ear pain, rhinorrhea, sinus pressure and trouble swallowing.   Respiratory: Negative for cough, chest tightness and shortness of breath.   Cardiovascular: Positive for chest pain.  Gastrointestinal: Negative for abdominal pain, nausea and vomiting.  Musculoskeletal: Negative for myalgias.  Skin: Negative for rash.  Neurological: Negative for dizziness, light-headedness and headaches.     Physical Exam Triage Vital Signs ED Triage Vitals [06/13/18 1253]  Enc Vitals Group     BP 113/67     Pulse Rate 86     Resp 18     Temp 98.3 F (36.8 C)     Temp src      SpO2 98 %     Weight      Height      Head Circumference  Peak Flow      Pain Score      Pain Loc      Pain Edu?      Excl. in GC?    No data found.  Updated Vital Signs BP 113/67   Pulse 86   Temp 98.3 F (36.8 C)   Resp 18   SpO2 98%   Visual Acuity Right Eye Distance:   Left Eye Distance:   Bilateral Distance:    Right Eye Near:   Left Eye Near:    Bilateral Near:     Physical Exam  Constitutional: She appears well-developed and well-nourished. No distress.  HENT:  Head: Normocephalic and atraumatic.  Bilateral ears without tenderness to palpation of external auricle, tragus and mastoid, EAC's without erythema or swelling, TM's with good bony landmarks and cone of light. Non erythematous.  Oral mucosa pink and moist, moderate tonsillar enlargement, slightly erythematous, no exudate.  Posterior pharynx patent and erythematous, no uvula deviation or swelling. Normal phonation.   Eyes: Pupils are equal, round, and reactive to light. Conjunctivae and EOM are normal.  Neck: Neck supple.  No cervical lymphadenopathy, tenderness to palpation over  tonsillar area.  Cardiovascular: Normal rate and regular rhythm.  No murmur heard. Pulmonary/Chest: Effort normal and breath sounds normal. No respiratory distress.  Breathing comfortably at rest, CTABL, no wheezing, rales or other adventitious sounds auscultated  Abdominal: Soft. There is no tenderness.  Musculoskeletal: She exhibits no edema.  Neurological: She is alert.  Skin: Skin is warm and dry.  Psychiatric: She has a normal mood and affect.  Nursing note and vitals reviewed.    UC Treatments / Results  Labs (all labs ordered are listed, but only abnormal results are displayed) Labs Reviewed  CULTURE, GROUP A STREP [facility])  POCT RAPID STREP A  POCT INFECTIOUS MONO SCREEN    EKG None  Radiology No results found.  Procedures Procedures (including critical care time)  Medications Ordered in UC Medications - No data to display  Initial Impression / Assessment and Plan / UC Course  I have reviewed the triage vital signs and the nursing notes.  Pertinent labs & imaging results that were available during my care of the patient were reviewed by me and considered in my medical decision making (see chart for details).     Strep test negative, mono negative.  Discussed doing a GI cocktail, patient concerned about cost.  We will go ahead and initiate trial of antacid as cause of sore throat as well as antihistamine.  Will do short course of prednisone to help with swelling of tonsils.  Discussed further symptomatic management.  Follow-up here or with ENT if symptoms persisting.Discussed strict return precautions. Patient verbalized understanding and is agreeable with plan.  Final Clinical Impressions(s) / UC Diagnoses   Final diagnoses:  Sore throat     Discharge Instructions     Sore Throat  Your rapid strep tested Negative today. We will send for a culture and call in about 2 days if results are positive. For now we will treat your sore throat as a virus with symptom  management.   Please continue Tylenol or Ibuprofen for fever and pain. May try salt water gargles, cepacol lozenges, throat spray, or OTC cold relief medicine for throat discomfort. If you also have congestion take a daily anti-histamine like Zyrtec, Claritin, and a oral decongestant to help with post nasal drip that may be irritating your throat.   Stay hydrated and drink plenty of fluids  to keep your throat coated relieve irritation.   Reflux:  Zantac/Ranitidine- 150 mg twice daily OR Omperazole/prilosec 20 mg daily   ED Prescriptions    Medication Sig Dispense Auth. Provider   predniSONE (DELTASONE) 50 MG tablet Take 1 tablet (50 mg total) by mouth daily for 5 days. 5 tablet Leila Schuff C, PA-C   Cetirizine HCl 10 MG CAPS Take 1 capsule (10 mg total) by mouth daily for 15 days. 15 capsule Ramar Nobrega C, PA-C   ranitidine (ZANTAC) 150 MG tablet Take 1 tablet (150 mg total) by mouth 2 (two) times daily. 60 tablet Bernardina Cacho, [place] C, PA-C     Controlled Substance Prescriptions [place] Controlled Substance Registry consulted? Not Applicable   Lew Dawes, [place] 06/13/18 1436

## 2018-06-13 NOTE — Discharge Instructions (Addendum)
Sore Throat  Your rapid strep tested Negative today. We will send for a culture and call in about 2 days if results are positive. For now we will treat your sore throat as a virus with symptom management.   Please continue Tylenol or Ibuprofen for fever and pain. May try salt water gargles, cepacol lozenges, throat spray, or OTC cold relief medicine for throat discomfort. If you also have congestion take a daily anti-histamine like Zyrtec, Claritin, and a oral decongestant to help with post nasal drip that may be irritating your throat.   Stay hydrated and drink plenty of fluids to keep your throat coated relieve irritation.   Reflux:  Zantac/Ranitidine- 150 mg twice daily OR Omperazole/prilosec 20 mg daily

## 2018-06-16 LAB — CULTURE, GROUP A STREP (THRC)

## 2019-05-26 ENCOUNTER — Encounter (HOSPITAL_COMMUNITY): Payer: Self-pay

## 2019-05-26 ENCOUNTER — Ambulatory Visit (HOSPITAL_COMMUNITY)
Admission: EM | Admit: 2019-05-26 | Discharge: 2019-05-26 | Disposition: A | Discharge status: Home / Self Care | Payer: Medicaid Other | Attending: Family Medicine | Admitting: Family Medicine

## 2019-05-26 ENCOUNTER — Other Ambulatory Visit: Payer: Self-pay

## 2019-05-26 DIAGNOSIS — M79641 Pain in right hand: Secondary | ICD-10-CM

## 2019-05-26 MED ORDER — KETOROLAC TROMETHAMINE 30 MG/ML IJ SOLN
30.0000 mg | Freq: Once | INTRAMUSCULAR | Status: AC
  Administered 2019-05-26: 30 mg via INTRAMUSCULAR

## 2019-05-26 MED ORDER — PREDNISONE 10 MG (21) PO TBPK: ORAL_TABLET | ORAL | 0 refills | Status: DC

## 2019-05-26 MED ORDER — KETOROLAC TROMETHAMINE 30 MG/ML IJ SOLN
INTRAMUSCULAR | Status: AC
  Filled 2019-05-26: qty 1

## 2019-05-26 NOTE — ED Triage Notes (Signed)
Patient presents to Urgent Care with complaints of bilateral hand pain, tingling up both arms with pain, on and off for 2 weeks. Patient reports she is not diabetic that she knows of, pt took aleve pta w/no improvement.

## 2019-05-26 NOTE — Discharge Instructions (Addendum)
Wrist splint given here to wear Toradol injection for pain Sending prednisone taper to pharmacy, take with food Follow up as needed for continued or worsening symptoms Primary care contact put on discharge instructions.  If your symptoms continue you need to follow with primary care for further evaluation management

## 2019-05-27 NOTE — ED Provider Notes (Signed)
[facility]    CSN: [id number] Arrival date & time: 05/26/19  1158     History   Chief Complaint Chief Complaint  Patient presents with  . Hand Pain    HPI Rebecca Brewer is a 33 y.o. female.   Patient is a 33 year old female that presents today with bilateral hand pain, swelling with pain radiating up the arms.  This is been waxing and waning over the past 2 weeks.  It started with the left hand and arm which somewhat improved and now is in the right.  Some associated numbness and tingling at times. She has been taking Aleve without any improvement.  She denies any specific injury but does repetitive movements at work with her hands.  Family history of arthritis. No fever.   ROS per HPI      History reviewed. No pertinent past medical history.  There are no active problems to display for this patient.   Past Surgical History:  Procedure Laterality Date  . CESAREAN SECTION      OB History    Gravida  3   Para  1   Term  1   Preterm      AB  2   Living  1     SAB      TAB  2   Ectopic      Multiple      Live Births  1            Home Medications    Prior to Admission medications   Medication Sig Start Date End Date Taking? Authorizing Provider  Cetirizine HCl 10 MG CAPS Take 1 capsule (10 mg total) by mouth daily for 15 days. 06/13/18 06/28/18  Wieters, Hallie C, PA-C  predniSONE (STERAPRED UNI-PAK 21 TAB) 10 MG (21) TBPK tablet 6 tabs for 1 day, then 5 tabs for 1 das, then 4 tabs for 1 day, then 3 tabs for 1 day, 2 tabs for 1 day, then 1 tab for 1 day 05/26/19   Janace Aris, NP    Family History Family History  Problem Relation Age of Onset  . Hypertension Mother   . Diabetes Mother   . Breast cancer Mother     Social History Social History   Tobacco Use  . Smoking status: Never Smoker  . Smokeless tobacco: Never Used  Substance Use Topics  . Alcohol use: Yes    Alcohol/week: 0.0 standard drinks    Comment:  occasional  . Drug use: No     Allergies   Patient has no known allergies.   Review of Systems Review of Systems   Physical Exam Triage Vital Signs ED Triage Vitals  Enc Vitals Group     BP 05/26/19 1314 (!) 147/87     Pulse Rate 05/26/19 1314 84     Resp 05/26/19 1314 18     Temp 05/26/19 1314 98.3 F (36.8 C)     Temp Source 05/26/19 1314 Oral     SpO2 05/26/19 1314 98 %     Weight --      Height --      Head Circumference --      Peak Flow --      Pain Score 05/26/19 1313 8     Pain Loc --      Pain Edu? --      Excl. in GC? --    No data found.  Updated Vital Signs BP (!) 147/87 (BP Location: Right  Arm)   Pulse 84   Temp 98.3 F (36.8 C) (Oral)   Resp 18   SpO2 98%   Visual Acuity Right Eye Distance:   Left Eye Distance:   Bilateral Distance:    Right Eye Near:   Left Eye Near:    Bilateral Near:     Physical Exam Vitals signs and nursing note reviewed.  Constitutional:      General: She is not in acute distress.    Appearance: Normal appearance. She is not ill-appearing, toxic-appearing or diaphoretic.  HENT:     Head: Normocephalic and atraumatic.     Nose: Nose normal.  Eyes:     Conjunctiva/sclera: Conjunctivae normal.  Neck:     Musculoskeletal: Normal range of motion.  Pulmonary:     Effort: Pulmonary effort is normal.  Musculoskeletal:        General: Swelling and tenderness present.     Comments: Swelling and tenderness noted to the right hand with limited ROM.no erythema. Radial pulse intact with good cap refill. Negative Finkelstein, Tinel sign Left hand normal   Skin:    General: Skin is warm and dry.     Capillary Refill: Capillary refill takes less than 2 seconds.     Findings: No bruising, erythema or rash.  Neurological:     Mental Status: She is alert.     Sensory: No sensory deficit.  Psychiatric:        Mood and Affect: Mood normal.      UC Treatments / Results  Labs (all labs ordered are listed, but only  abnormal results are displayed) Labs Reviewed - No data to display  EKG None  Radiology No results found.  Procedures Procedures (including critical care time)  Medications Ordered in UC Medications  ketorolac (TORADOL) 30 MG/ML injection 30 mg (30 mg Intramuscular Given 05/26/19 1409)    Initial Impression / Assessment and Plan / UC Course  I have reviewed the triage vital signs and the nursing notes.  Pertinent labs & imaging results that were available during my care of the patient were reviewed by me and considered in my medical decision making (see chart for details).     Symptoms consistent with some inflammatory process to include tendinitis or arthritis.  Cannot rule out rheumatoid arthritis We will put her in a thumb spica splint on the right side and have her do prednisone taper Toradol injection given here in clinic If her symptoms continue or recur she will need to see a primary care provider for further work-up. Pt understanding and agrees.  Final Clinical Impressions(s) / UC Diagnoses   Final diagnoses:  Hand pain, right     Discharge Instructions     Wrist splint given here to wear Toradol injection for pain Sending prednisone taper to pharmacy, take with food Follow up as needed for continued or worsening symptoms Primary care contact put on discharge instructions.  If your symptoms continue you need to follow with primary care for further evaluation management    ED Prescriptions    Medication Sig Dispense Auth. Provider   predniSONE (STERAPRED UNI-PAK 21 TAB) 10 MG (21) TBPK tablet 6 tabs for 1 day, then 5 tabs for 1 das, then 4 tabs for 1 day, then 3 tabs for 1 day, 2 tabs for 1 day, then 1 tab for 1 day 21 tablet Jaci LazierBast, Syrita Dovel A, NP     Controlled Substance Prescriptions [place] Controlled Substance Registry consulted? Not Applicable   Janace ArisBast, Ashir Kunz A, NP  05/27/19 1253  

## 2019-07-27 ENCOUNTER — Ambulatory Visit (HOSPITAL_COMMUNITY)
Admission: EM | Admit: 2019-07-27 | Discharge: 2019-07-27 | Disposition: A | Discharge status: Home / Self Care | Payer: Medicaid Other | Attending: Emergency Medicine | Admitting: Emergency Medicine

## 2019-07-27 ENCOUNTER — Encounter (HOSPITAL_COMMUNITY): Payer: Self-pay | Admitting: Emergency Medicine

## 2019-07-27 ENCOUNTER — Other Ambulatory Visit: Payer: Self-pay

## 2019-07-27 DIAGNOSIS — M79641 Pain in right hand: Secondary | ICD-10-CM

## 2019-07-27 MED ORDER — PREDNISONE 10 MG (21) PO TBPK: ORAL_TABLET | ORAL | 0 refills | Status: DC

## 2019-07-27 NOTE — Discharge Instructions (Addendum)
May take tylenol or naproxen for additional pain relief.  Take steroid as directed. Return for worsening pain, decreased movement, numbness, weakness.

## 2019-07-27 NOTE — ED Triage Notes (Signed)
Pt here for right hand and shoulder pain x several weeks; pt seen here for same and has wrist brace

## 2019-08-03 ENCOUNTER — Ambulatory Visit: Payer: Self-pay | Admitting: Internal Medicine

## 2019-08-03 ENCOUNTER — Encounter: Payer: Self-pay | Admitting: Internal Medicine

## 2019-08-03 ENCOUNTER — Other Ambulatory Visit: Payer: Self-pay

## 2019-08-03 VITALS — BP 141/110 | HR 106 | Temp 98.2°F | Ht 61.0 in | Wt 293.0 lb

## 2019-08-03 DIAGNOSIS — F4321 Adjustment disorder with depressed mood: Secondary | ICD-10-CM

## 2019-08-03 DIAGNOSIS — F329 Major depressive disorder, single episode, unspecified: Secondary | ICD-10-CM | POA: Insufficient documentation

## 2019-08-03 DIAGNOSIS — R030 Elevated blood-pressure reading, without diagnosis of hypertension: Secondary | ICD-10-CM | POA: Insufficient documentation

## 2019-08-03 DIAGNOSIS — F321 Major depressive disorder, single episode, moderate: Secondary | ICD-10-CM

## 2019-08-03 DIAGNOSIS — I10 Essential (primary) hypertension: Secondary | ICD-10-CM

## 2019-08-03 MED ORDER — FLUOXETINE HCL 10 MG PO CAPS: 10.0000 mg | ORAL_CAPSULE | Freq: Every day | ORAL | 3 refills | Status: DC

## 2019-08-03 MED ORDER — FLUOXETINE HCL 10 MG PO CAPS: 10.0000 mg | ORAL_CAPSULE | Freq: Every day | ORAL | 3 refills | Status: AC

## 2019-08-03 NOTE — Assessment & Plan Note (Signed)
Grief/major depressive disorder: Ms. Rebecca Brewer states to me that about a year ago she lost her mom to a liver condition after she was placed on hospice.  Since then, she has had episodes of anhedonia, decreased desire to participate in her daily activities and no desire to engage with her family sometimes.  She works at [employer] at [employer] and has been taking extra shifts to clear her mind.  She has siblings in [place] and she tries to visit them once or twice a month.  The last time she visited them was last week when she felt down.  Currently, she denies suicidal or homicidal ideations.  She has been seeing a grief counselor however due to coronavirus pandemic they only call her on the phone.  Her mother's name was [place] and she was the only one that lives with her in [place] with her daughter.  Plan: -Set up appointment with [facility] a trial of low-dose fluoxetine -Reassess in 1 month

## 2019-08-03 NOTE — Progress Notes (Signed)
   CC: "Feeling down"  HPI:  [name] is a 33 y.o. very pleasant African-American woman with no significant past medical history besides "elevated blood pressure "presenting to be evaluated for depressed mood.  Please see problem based charting for further details.  No past medical history on file. Review of Systems:  As per HPI  Physical Exam:  Vitals:   08/03/19 0839  BP: (!) 141/110  Pulse: (!) 106  Temp: 98.2 F (36.8 C)  TempSrc: Oral  SpO2: 90%  Weight: 293 lb (132.9 kg)  Height: 5\' 1"  (1.549 m)   Physical Exam Vitals signs and nursing note reviewed.  Constitutional:      Appearance: She is obese. She is not toxic-appearing.  Cardiovascular:     Rate and Rhythm: Tachycardia present.     Heart sounds: Normal heart sounds.  Pulmonary:     Effort: Pulmonary effort is normal.     Breath sounds: Normal breath sounds.  Psychiatric:        Attention and Perception: Perception normal. She is attentive.        Mood and Affect: Mood is depressed. Affect is flat and tearful. Affect is not blunt.        Speech: Speech normal. She is communicative. Speech is not delayed.        Behavior: Behavior normal.        Thought Content: Thought content normal. Thought content does not include homicidal or suicidal ideation. Thought content does not include homicidal or suicidal plan.        Cognition and Memory: Cognition and memory normal.     Assessment & Plan:   See Encounters Tab for problem based charting.  Patient discussed with Dr. Dareen Piano

## 2019-08-03 NOTE — Patient Instructions (Signed)
Ms. Rudden,  It was a pleasure taking care of you here at the clinic today.  I am so sorry to hear about the loss of your mom and that have been a huge toll on you.  I would suggest that you continue seen your sisters in [place] once or twice a month.  As we discussed in the room, I am going to start you on a medication which will help you with depression called fluoxetine.  It might take about a month for this medication to take effect.  I will see you back here in 1 month.  Also, I will set you up with our clinic behavioral health representative who would help you.  Dr. Eileen Stanford  Please call the internal medicine center clinic if you have any questions or concerns, we may be able to help and keep you from a long and expensive emergency room wait. Our clinic and after hours phone number is [phone], the best time to call is Monday through Friday 9 am to 4 pm but there is always someone available 24/7 if you have an emergency. If you need medication refills please notify your pharmacy one week in advance and they will send [place] a request.

## 2019-08-03 NOTE — Assessment & Plan Note (Signed)
Essential hypertension: She has not been officially diagnosed with hypertension however per chart review her BP readings have been in the 140s/80s-110s since May 2020.  She does not have a PCP and utilizes the emergency department.  I did advise her on lifestyle modifications and reassessment in a month  Plan: - Lifestyle modifications -Follow-up in 1 month and consider starting antihypertensive (low-dose Dyazide) - Routine labs ordered today (CBC, CMP, TSH, A1c)

## 2019-08-03 NOTE — Progress Notes (Signed)
Internal Medicine Clinic Attending  Case discussed with Dr. Agyei at the time of the visit.  We reviewed the resident's history and exam and pertinent patient test results.  I agree with the assessment, diagnosis, and plan of care documented in the resident's note.    

## 2019-08-04 LAB — CMP14 + ANION GAP
ALT: 24 IU/L (ref 0–32)
AST: 17 IU/L (ref 0–40)
Albumin/Globulin Ratio: 1.4 (ref 1.2–2.2)
Albumin: 4.1 g/dL (ref 3.8–4.8)
Alkaline Phosphatase: 69 IU/L (ref 39–117)
Anion Gap: 18 mmol/L (ref 10.0–18.0)
BUN/Creatinine Ratio: 14 (ref 9–23)
BUN: 11 mg/dL (ref 6–20)
Bilirubin Total: <0.2 mg/dL (ref 0.0–1.2)
CO2: 22 mmol/L (ref 20–29)
Calcium: 8.8 mg/dL (ref 8.7–10.2)
Chloride: 99 mmol/L (ref 96–106)
Creatinine, Ser: 0.79 mg/dL (ref 0.57–1.00)
GFR calc Af Amer: 114 mL/min/{1.73_m2} (ref >59)
GFR calc non Af Amer: 99 mL/min/{1.73_m2} (ref >59)
Globulin, Total: 2.9 g/dL (ref 1.5–4.5)
Glucose: 104 mg/dL — ABNORMAL HIGH (ref 65–99)
Potassium: 4.1 mmol/L (ref 3.5–5.2)
Sodium: 139 mmol/L (ref 134–144)
Total Protein: 7 g/dL (ref 6.0–8.5)

## 2019-08-04 LAB — CBC
Hematocrit: 37.1 % (ref 34.0–46.6)
Hemoglobin: 11.9 g/dL (ref 11.1–15.9)
MCH: 27.2 pg (ref 26.6–33.0)
MCHC: 32.1 g/dL (ref 31.5–35.7)
MCV: 85 fL (ref 79–97)
Platelets: 446 10*3/uL (ref 150–450)
RBC: 4.38 x10E6/uL (ref 3.77–5.28)
RDW: 12.8 % (ref 11.7–15.4)
WBC: 10.4 10*3/uL (ref 3.4–10.8)

## 2019-08-04 LAB — HEMOGLOBIN A1C
Est. average glucose Bld gHb Est-mCnc: 120 mg/dL
Hgb A1c MFr Bld: 5.8 % — ABNORMAL HIGH (ref 4.8–5.6)

## 2019-08-04 LAB — TSH: TSH: 0.703 u[IU]/mL (ref 0.450–4.500)

## 2019-08-05 ENCOUNTER — Telehealth: Payer: Self-pay | Admitting: Internal Medicine

## 2019-08-05 ENCOUNTER — Encounter (HOSPITAL_COMMUNITY): Payer: Self-pay | Admitting: Emergency Medicine

## 2019-08-05 NOTE — Telephone Encounter (Signed)
Called Rebecca Brewer to discuss lab results. Overall, unremarkable.

## 2019-08-05 NOTE — ED Provider Notes (Signed)
[facility]    CSN: [id number] Arrival date & time: 07/27/19  [id number]     History   Chief Complaint Chief Complaint  Patient presents with  . Arm Pain    HPI Rebecca Brewer is a 33 y.o. female with history of hypertension presenting for right hand pain that radiates up into her shoulder.  Patient was seen for this on 5/20: Patient given Toradol injection, prednisone taper, thumb spica splint with adequate relief of symptoms.  Patient states her symptoms started flaring over this past week: Has not yet seen a specialist for this, though does have a PCP with him she sees routinely.  Patient has been wearing her wrist splint which "helps a bit ".    History reviewed. No pertinent past medical history.  Patient Active Problem List   Diagnosis Date Noted  . Major depression 08/03/2019  . Elevated blood pressure reading 08/03/2019    Past Surgical History:  Procedure Laterality Date  . CESAREAN SECTION      OB History    Gravida  3   Para  1   Term  1   Preterm      AB  2   Living  1     SAB      TAB  2   Ectopic      Multiple      Live Births  1            Home Medications    Prior to Admission medications   Medication Sig Start Date End Date Taking? Authorizing Provider  Cetirizine HCl 10 MG CAPS Take 1 capsule (10 mg total) by mouth daily for 15 days. 06/13/18 06/28/18  Wieters, Hallie C, PA-C  FLUoxetine (PROZAC) 10 MG capsule Take 1 capsule (10 mg total) by mouth daily. 08/03/19   Agyei, Hermina Staggersbed K, MD  predniSONE (STERAPRED UNI-PAK 21 TAB) 10 MG (21) TBPK tablet 6 tabs for 1 day, then 5 tabs for 1 das, then 4 tabs for 1 day, then 3 tabs for 1 day, 2 tabs for 1 day, then 1 tab for 1 day 07/27/19   Hall-Potvin, [place], PA-C    Family History Family History  Problem Relation Age of Onset  . Hypertension Mother   . Diabetes Mother   . Breast cancer Mother     Social History Social History   Tobacco Use  . Smoking status: Never  Smoker  . Smokeless tobacco: Never Used  Substance Use Topics  . Alcohol use: Yes    Alcohol/week: 0.0 standard drinks    Comment: occasional  . Drug use: No     Allergies   Patient has no known allergies.   Review of Systems Review of Systems  Constitutional: Negative for fatigue and fever.  HENT: Negative for ear pain, sinus pain, sore throat and voice change.   Eyes: Negative for pain, redness and visual disturbance.  Respiratory: Negative for cough and shortness of breath.   Cardiovascular: Negative for chest pain and palpitations.  Gastrointestinal: Negative for abdominal pain, diarrhea and vomiting.  Musculoskeletal: Negative for back pain, gait problem and myalgias.  Skin: Negative for rash and wound.  Neurological: Negative for dizziness, tremors, syncope, facial asymmetry, weakness, light-headedness, numbness and headaches.     Physical Exam Triage Vital Signs ED Triage Vitals  Enc Vitals Group     BP 07/27/19 1708 (!) 141/96     Pulse Rate 07/27/19 1708 87     Resp 07/27/19 1708 18  Temp 07/27/19 1708 98.9 F (37.2 C)     Temp Source 07/27/19 1708 Oral     SpO2 07/27/19 1708 99 %     Weight --      Height --      Head Circumference --      Peak Flow --      Pain Score 07/27/19 1709 6     Pain Loc --      Pain Edu? --      Excl. in [place]? --    No data found.  Updated Vital Signs BP (!) 141/96 (BP Location: Right Arm)   Pulse 87   Temp 98.9 F (37.2 C) (Oral)   Resp 18   SpO2 99%   Visual Acuity Right Eye Distance:   Left Eye Distance:   Bilateral Distance:    Right Eye Near:   Left Eye Near:    Bilateral Near:     Physical Exam Constitutional:      General: She is not in acute distress.    Appearance: She is obese. She is not ill-appearing.  HENT:     Head: Normocephalic and atraumatic.  Eyes:     General: No scleral icterus.    Pupils: Pupils are equal, round, and reactive to light.  Cardiovascular:     Rate and Rhythm: Normal  rate.  Pulmonary:     Effort: Pulmonary effort is normal.  Musculoskeletal:     Comments: Right hand and distal forearm with mild to moderate edema.  No erythema, ecchymosis.  Patient has slightly decreased active range of motion second to edema/"tightness".  No bony tenderness.  Mildly decreased grip strength as compared to left second to pain/swelling.  Negative Tinel's, negative Phalen's.  NVI.    Skin:    Coloration: Skin is not jaundiced or pale.  Neurological:     Mental Status: She is alert and oriented to person, place, and time.      UC Treatments / Results  Labs (all labs ordered are listed, but only abnormal results are displayed) Labs Reviewed - No data to display  EKG   Radiology No results found.  Procedures Procedures (including critical care time)  Medications Ordered in UC Medications - No data to display  Initial Impression / Assessment and Plan / UC Course  I have reviewed the triage vital signs and the nursing notes.  Pertinent labs & imaging results that were available during my care of the patient were reviewed by me and considered in my medical decision making (see chart for details).     1.  Right hand pain Patient to continue use of brace, will start prednisone taper today and have patient follow-up with PCP.  Also discussed OTC analgesia supplementation as listed below.  Return precautions discussed, patient verbalized understanding and is agreeable to plan. Final Clinical Impressions(s) / UC Diagnoses   Final diagnoses:  Hand pain, right     Discharge Instructions     May take tylenol or naproxen for additional pain relief.  Take steroid as directed. Return for worsening pain, decreased movement, numbness, weakness.    ED Prescriptions    Medication Sig Dispense Auth. Provider   predniSONE (STERAPRED UNI-PAK 21 TAB) 10 MG (21) TBPK tablet 6 tabs for 1 day, then 5 tabs for 1 das, then 4 tabs for 1 day, then 3 tabs for 1 day, 2 tabs for  1 day, then 1 tab for 1 day 21 tablet Hall-Potvin, [place], PA-C     Controlled  Substance Prescriptions [place] Controlled Substance Registry consulted? Not Applicable   Shea EvansHall-Potvin, Brittany, [place] JerseyPA-C 08/06/19 [id number]

## 2019-08-24 ENCOUNTER — Ambulatory Visit: Payer: Medicaid Other

## 2019-08-31 ENCOUNTER — Ambulatory Visit: Payer: Medicaid Other

## 2019-08-31 ENCOUNTER — Telehealth: Payer: Self-pay | Admitting: *Deleted

## 2019-08-31 ENCOUNTER — Encounter: Payer: Self-pay | Admitting: Internal Medicine

## 2019-08-31 NOTE — Telephone Encounter (Signed)
Call to patient to follow up on missed appointment today.  Message left on VM that she had an appointment and option to speak with a doctor over the phone or to call to reschedule the appoinrment for another day or time.  Sander Nephew, RN 08/31/2019 9:51 AM.

## 2020-04-11 ENCOUNTER — Encounter: Payer: Self-pay | Admitting: Internal Medicine

## 2020-04-11 ENCOUNTER — Other Ambulatory Visit: Payer: Self-pay

## 2020-04-11 ENCOUNTER — Ambulatory Visit (INDEPENDENT_AMBULATORY_CARE_PROVIDER_SITE_OTHER): Payer: Self-pay | Admitting: Internal Medicine

## 2020-04-11 VITALS — BP 151/96 | HR 88 | Temp 98.7°F | Ht 61.0 in | Wt 303.4 lb

## 2020-04-11 DIAGNOSIS — Z975 Presence of (intrauterine) contraceptive device: Secondary | ICD-10-CM

## 2020-04-11 DIAGNOSIS — G5602 Carpal tunnel syndrome, left upper limb: Secondary | ICD-10-CM

## 2020-04-11 MED ORDER — PREDNISONE 50 MG PO TABS: ORAL_TABLET | ORAL | 0 refills | Status: DC

## 2020-04-11 NOTE — Patient Instructions (Addendum)
Rebecca Brewer, please start wearing your brace nightly and purchase another brace for the right hand.  You can wear these nightly and during the day if they help and or you are able to wear them at work.  Additionally, we will prescribe a few days of a steroid to help get you through this pain.  Please follow up with our financial counselors so we can get you assistance and more definitive management in the future.

## 2020-04-11 NOTE — Progress Notes (Signed)
Internal Medicine Clinic Attending  Case discussed with Dr. Winfrey at the time of the visit.  We reviewed the resident's history and exam and pertinent patient test results.  I agree with the assessment, diagnosis, and plan of care documented in the resident's note.  Marlina Cataldi, M.D., Ph.D.  

## 2020-04-11 NOTE — Assessment & Plan Note (Addendum)
Pt with bilateral wrist and hand pain and numbness since at least May of last year.  She also came to our clinic with this issue but says was sidetracked by discussion of her depression and hypertension. Pain is intermittent and severe.  Woke her up from sleep last night.  Numbness, pins and needles feeling.  She works at [employer] and is constantly working with her hands.  She does have a brace for her left hand but feels the relief is minimal but only using it PRN.  Sometimes massage and warm water helps.  Most effective therapy she has had in the past has been glucocorticoids.  Phalen test positive on left not on right.  She uses the mirena for birth control, placed by her OB doctor.    -short prednisone course -she will purchase another carpal tunnel brace and wear these a minimum of nightly -financial counselor referral will need some sort of insurance for definitive management -wrote a letter for a few days off work -encouraged weight loss

## 2020-04-11 NOTE — Progress Notes (Signed)
   CC: carpal tunnel syndrome  HPI:  [name] is a 34 y.o. female with PMH below.  Today we will address carpal tunnel syndrome   Please see A&P for status of the patient's chronic medical conditions  No past medical history on file. Review of Systems:  ROS: Pulmonary: pt denies increased work of breathing, shortness of breath,  Cardiac: pt denies palpitations, chest pain,  Abdominal: pt denies abdominal pain, nausea, vomiting, or diarrhea   Physical Exam:  Vitals:   04/11/20 1004 04/11/20 1005  BP:  (!) 151/96  Pulse:  88  Temp:  98.7 F (37.1 C)  TempSrc:  Oral  SpO2:  98%  Weight: (!) 303 lb 6.4 oz (137.6 kg)   Height: 5\' 1"  (1.549 m)    Cardiac:  normal rate and rhythm, clear s1 and s2, no murmurs, rubs or gallops Pulmonary: CTAB, not in distress Abdominal: non distended abdomen, soft and nontender MSK: phalen positive on left, tinel negative bilaterally.  No erythema or edema of writs or hands bilaterally.  Normal radial pulses and sensation bilaterally. No hypothenar atrophy bilaterally     Social History   Socioeconomic History  . Marital status: Single    Spouse name: Not on file  . Number of children: Not on file  . Years of education: Not on file  . Highest education level: Not on file  Occupational History  . Not on file  Tobacco Use  . Smoking status: Never Smoker  . Smokeless tobacco: Never Used  Substance and Sexual Activity  . Alcohol use: Yes    Alcohol/week: 0.0 standard drinks    Comment: occasional  . Drug use: No  . Sexual activity: Yes    Partners: Male    Birth control/protection: I.U.D.    Comment: placed 2016  Other Topics Concern  . Not on file  Social History Narrative  . Not on file   Social Determinants of Health   Financial Resource Strain:   . Difficulty of Paying Living Expenses:   Food Insecurity:   . Worried About 2017 in the Last Year:   . Programme researcher, broadcasting/film/video in the Last Year:   Transportation  Needs:   . Barista (Medical):   Freight forwarder Lack of Transportation (Non-Medical):   Physical Activity:   . Days of Exercise per Week:   . Minutes of Exercise per Session:   Stress:   . Feeling of Stress :   Social Connections:   . Frequency of Communication with Friends and Family:   . Frequency of Social Gatherings with Friends and Family:   . Attends Religious Services:   . Active Member of Clubs or Organizations:   . Attends [employer] Meetings:   Banker Marital Status:   Intimate Partner Violence:   . Fear of Current or Ex-Partner:   . Emotionally Abused:   [employer] Physically Abused:   . Sexually Abused:     Family History  Problem Relation Age of Onset  . Hypertension Mother   . Diabetes Mother   . Breast cancer Mother     Assessment & Plan:   See Encounters Tab for problem based charting.  Patient discussed with Dr. Marland Kitchen

## 2020-05-09 ENCOUNTER — Ambulatory Visit: Payer: Medicaid Other

## 2021-01-12 ENCOUNTER — Other Ambulatory Visit: Payer: Self-pay

## 2021-01-12 ENCOUNTER — Ambulatory Visit (HOSPITAL_COMMUNITY)
Admission: EM | Admit: 2021-01-12 | Discharge: 2021-01-12 | Disposition: A | Discharge status: Home / Self Care | Payer: HRSA Program | Attending: Family Medicine | Admitting: Family Medicine

## 2021-01-12 ENCOUNTER — Encounter (HOSPITAL_COMMUNITY): Payer: Self-pay

## 2021-01-12 DIAGNOSIS — F329 Major depressive disorder, single episode, unspecified: Secondary | ICD-10-CM | POA: Insufficient documentation

## 2021-01-12 DIAGNOSIS — R531 Weakness: Secondary | ICD-10-CM | POA: Insufficient documentation

## 2021-01-12 DIAGNOSIS — R251 Tremor, unspecified: Secondary | ICD-10-CM | POA: Diagnosis not present

## 2021-01-12 DIAGNOSIS — F419 Anxiety disorder, unspecified: Secondary | ICD-10-CM | POA: Diagnosis not present

## 2021-01-12 DIAGNOSIS — U071 COVID-19: Secondary | ICD-10-CM | POA: Insufficient documentation

## 2021-01-12 DIAGNOSIS — R509 Fever, unspecified: Secondary | ICD-10-CM | POA: Insufficient documentation

## 2021-01-12 DIAGNOSIS — R059 Cough, unspecified: Secondary | ICD-10-CM | POA: Diagnosis present

## 2021-01-12 DIAGNOSIS — R519 Headache, unspecified: Secondary | ICD-10-CM | POA: Diagnosis not present

## 2021-01-12 DIAGNOSIS — Z79899 Other long term (current) drug therapy: Secondary | ICD-10-CM | POA: Insufficient documentation

## 2021-01-12 DIAGNOSIS — R6889 Other general symptoms and signs: Secondary | ICD-10-CM

## 2021-01-12 LAB — SARS CORONAVIRUS 2 (TAT 6-24 HRS): SARS Coronavirus 2: POSITIVE — AB

## 2021-01-12 LAB — POCT RAPID STREP A, ED / UC: Streptococcus, Group A Screen (Direct): NEGATIVE

## 2021-01-12 MED ORDER — ACETAMINOPHEN 325 MG PO TABS
650.0000 mg | ORAL_TABLET | Freq: Once | ORAL | Status: AC
  Administered 2021-01-12: 650 mg via ORAL

## 2021-01-12 MED ORDER — ACETAMINOPHEN 325 MG PO TABS
ORAL_TABLET | ORAL | Status: AC
  Filled 2021-01-12: qty 2

## 2021-01-12 NOTE — ED Provider Notes (Addendum)
[facility]    CSN: [id number] Arrival date & time: 01/12/21  1041      History   Chief Complaint Chief Complaint  Patient presents with  . Cough  . Generalized Body Aches  . Weakness    HPI Rebecca Brewer is a 35 y.o. female.   HPI  Patient states she was well yesterday morning. Towards the afternoon she felt cough and congestion. She then today has headache, body aches, shakes and chills and fever. This is also fairly sudden onset. 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 She works at a [employer] area. No definite known exposure to COVID or strep. She states her throat is very painful. She has a 47 year old daughter at home. Her daughter is well She states she is generally in good health. Only prescription is Prozac for depression and anxiety. She did get the COVID vaccinations  History reviewed. No pertinent past medical history.  Patient Active Problem List   Diagnosis Date Noted  . Carpal tunnel syndrome of left wrist 04/11/2020  . Major depression 08/03/2019  . Elevated blood pressure reading 08/03/2019    Past Surgical History:  Procedure Laterality Date  . CESAREAN SECTION      OB History    Gravida  3   Para  1   Term  1   Preterm      AB  2   Living  1     SAB      IAB  2   Ectopic      Multiple      Live Births  1            Home Medications    Prior to Admission medications   Medication Sig Start Date End Date Taking? Authorizing Provider  Cetirizine HCl 10 MG CAPS Take 1 capsule (10 mg total) by mouth daily for 15 days. 06/13/18 06/28/18  Wieters, Hallie C, PA-C  FLUoxetine (PROZAC) 10 MG capsule Take 1 capsule (10 mg total) by mouth daily. 08/03/19   Yvette Rack, MD    Family History Family History  Problem Relation Age of Onset  . Hypertension Mother   . Diabetes Mother   . Breast cancer Mother     Social History Social History   Tobacco Use  . Smoking status: Never Smoker  . Smokeless  tobacco: Never Used  Vaping Use  . Vaping Use: Never used  Substance Use Topics  . Alcohol use: Yes    Alcohol/week: 0.0 standard drinks    Comment: occasional  . Drug use: No     Allergies   Patient has no known allergies.   Review of Systems Review of Systems See HPI  Physical Exam Triage Vital Signs ED Triage Vitals  Enc Vitals Group     BP 01/12/21 1121 127/85     Pulse Rate 01/12/21 1121 (!) 107     Resp 01/12/21 1121 20     Temp 01/12/21 1121 (!) 102.9 F (39.4 C)     Temp Source 01/12/21 1121 Oral     SpO2 01/12/21 1121 98 %     Weight --      Height --      Head Circumference --      Peak Flow --      Pain Score 01/12/21 1119 7     Pain Loc --      Pain Edu? --      Excl. in [place]? --  No data found.  Updated Vital Signs BP 127/85 (BP Location: Right Arm)   Pulse (!) 107   Temp (!) 102.9 F (39.4 C) (Oral)   Resp 20   SpO2 98%      Physical Exam Constitutional:      General: She is not in acute distress.    Appearance: She is well-developed and well-nourished. She is obese.  HENT:     Head: Normocephalic and atraumatic.     Right Ear: Tympanic membrane and ear canal normal.     Left Ear: Tympanic membrane and ear canal normal.     Nose: Nose normal. No congestion.     Mouth/Throat:     Pharynx: Posterior oropharyngeal erythema present.     Comments: Tonsils large. No exudate. Mild erythema Eyes:     Conjunctiva/sclera: Conjunctivae normal.     Pupils: Pupils are equal, round, and reactive to light.  Cardiovascular:     Rate and Rhythm: Regular rhythm. Tachycardia present.     Heart sounds: Normal heart sounds.  Pulmonary:     Effort: Pulmonary effort is normal. No respiratory distress.     Breath sounds: Normal breath sounds.     Comments: Heart and lung exam normal. Abdominal:     General: There is no distension.     Palpations: Abdomen is soft.  Musculoskeletal:        General: No edema. Normal range of motion.     Cervical back:  Normal range of motion.  Lymphadenopathy:     Cervical: Cervical adenopathy present.  Skin:    General: Skin is warm and dry.  Neurological:     Mental Status: She is alert.  Psychiatric:        Behavior: Behavior normal.      UC Treatments / Results  Labs (all labs ordered are listed, but only abnormal results are displayed) Labs Reviewed  SARS CORONAVIRUS 2 (TAT 6-24 HRS)  POCT RAPID STREP A, ED / UC    EKG   Radiology No results found.  Procedures Procedures (including critical care time)  Medications Ordered in UC Medications  acetaminophen (TYLENOL) tablet 650 mg (650 mg Oral Given 01/12/21 1136)    Initial Impression / Assessment and Plan / UC Course  I have reviewed the triage vital signs and the nursing notes.  Pertinent labs & imaging results that were available during my care of the patient were reviewed by me and considered in my medical decision making (see chart for details).     Patient has flulike symptoms. Will test for COVID. Rapid strep is negative. Patient is home to rest. Importance of quarantine discussed. Final Clinical Impressions(s) / UC Diagnoses   Final diagnoses:  Flu-like symptoms     Discharge Instructions     Go home to rest.  Strep is negative Drink plenty of fluids Take Tylenol for pain or fever You may take over-the-counter cough and cold medicines as needed You must quarantine at home until your test result is available You can check for your test result in MyChart    ED Prescriptions    None     PDMP not reviewed this encounter.   Eustace Moore, MD 01/12/21 1220    Eustace Moore, MD 01/12/21 1248

## 2021-01-12 NOTE — ED Triage Notes (Signed)
Pt presents with non productive cough, congestion, generalized body aches, headache, and weakness since yesterday.

## 2021-01-12 NOTE — Discharge Instructions (Addendum)
Go home to rest.  Strep is negative Drink plenty of fluids Take Tylenol for pain or fever You may take over-the-counter cough and cold medicines as needed You must quarantine at home until your test result is available You can check for your test result in MyChart

## 2021-01-15 LAB — CULTURE, GROUP A STREP (THRC)

## 2021-07-04 ENCOUNTER — Encounter: Payer: Self-pay | Admitting: *Deleted

## 2021-09-21 ENCOUNTER — Other Ambulatory Visit: Payer: Self-pay

## 2021-09-21 ENCOUNTER — Ambulatory Visit (HOSPITAL_COMMUNITY)
Admission: EM | Admit: 2021-09-21 | Discharge: 2021-09-21 | Disposition: A | Discharge status: Home / Self Care | Payer: Self-pay | Attending: Internal Medicine | Admitting: Internal Medicine

## 2021-09-21 ENCOUNTER — Encounter (HOSPITAL_COMMUNITY): Payer: Self-pay

## 2021-09-21 DIAGNOSIS — J029 Acute pharyngitis, unspecified: Secondary | ICD-10-CM | POA: Insufficient documentation

## 2021-09-21 DIAGNOSIS — Z20822 Contact with and (suspected) exposure to covid-19: Secondary | ICD-10-CM | POA: Insufficient documentation

## 2021-09-21 DIAGNOSIS — R059 Cough, unspecified: Secondary | ICD-10-CM | POA: Insufficient documentation

## 2021-09-21 DIAGNOSIS — R0981 Nasal congestion: Secondary | ICD-10-CM | POA: Insufficient documentation

## 2021-09-21 MED ORDER — BENZONATATE 100 MG PO CAPS: 100.0000 mg | ORAL_CAPSULE | Freq: Three times a day (TID) | ORAL | 0 refills | Status: DC | PRN

## 2021-09-21 MED ORDER — IBUPROFEN 600 MG PO TABS: 600.0000 mg | ORAL_TABLET | Freq: Four times a day (QID) | ORAL | 0 refills | Status: DC | PRN

## 2021-09-21 MED ORDER — SALINE SPRAY 0.65 % NA SOLN: 1.0000 | NASAL | 0 refills | Status: DC | PRN

## 2021-09-21 MED ORDER — CETIRIZINE HCL 10 MG PO CAPS: 10.0000 mg | ORAL_CAPSULE | Freq: Every day | ORAL | 0 refills | Status: DC

## 2021-09-21 NOTE — Discharge Instructions (Addendum)
Please take your medications as prescribed Please quarantine until COVID-19 test results available Increase oral fluid intake Return to urgent care if symptoms worsen We will call you with recommendations if labs are abnormal.

## 2021-09-21 NOTE — ED Triage Notes (Signed)
Pt present coughing with nasal congestion and drainage. Symptom started last Saturday. Pt states her chest hurts when she cough.

## 2021-09-22 LAB — SARS CORONAVIRUS 2 (TAT 6-24 HRS): SARS Coronavirus 2: NEGATIVE

## 2021-09-22 NOTE — ED Provider Notes (Signed)
[facility]    CSN: [id number] Arrival date & time: 09/21/21  1751      History   Chief Complaint Chief Complaint  Patient presents with   Cough   Nasal Congestion    HPI Rebecca Brewer is a 35 y.o. female comes to the urgent care with a 2-day history of nasal congestion, sore throat, nonproductive cough.  Patient returned from a cruise over the weekend.  His symptoms started while she was on the cruise.  No nausea, vomiting, diarrhea or any sick contacts.  Patient denies shortness of breath or wheezing.  No fever or chills.  Home COVID test was negative.  A coworker tested positive for COVID-19. HPI  History reviewed. No pertinent past medical history.  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 Authorizing Provider  benzonatate (TESSALON) 100 MG capsule Take 1 capsule (100 mg total) by mouth 3 (three) times daily as needed for cough. 09/21/21  Yes Marilla Boddy, Britta Mccreedy, MD  ibuprofen (ADVIL) 600 MG tablet Take 1 tablet (600 mg total) by mouth every 6 (six) hours as needed. 09/21/21  Yes Jayleen Scaglione, Britta Mccreedy, MD  sodium chloride (OCEAN) 0.65 % SOLN nasal spray Place 1 spray into both nostrils as needed for congestion. 09/21/21  Yes Tyera Hansley, Britta Mccreedy, MD  Cetirizine HCl 10 MG CAPS Take 1 capsule (10 mg total) by mouth daily for 15 days. 09/21/21 10/06/21  Merrilee Jansky, MD  FLUoxetine (PROZAC) 10 MG capsule Take 1 capsule (10 mg total) by mouth daily. 08/03/19   Yvette Rack, MD     Family History Family History  Problem Relation Age of Onset   Hypertension Mother    Diabetes Mother    Breast cancer Mother     Social History Social History   Tobacco Use   Smoking status: Never   Smokeless tobacco: Never  Vaping Use   Vaping Use: Never used  Substance Use Topics   Alcohol use: Yes    Alcohol/week: 0.0 standard drinks    Comment: occasional   Drug use: No     Allergies   Patient has no known allergies.   Review of Systems Review of Systems  Constitutional:  Positive for activity change and fatigue. Negative for chills and fever.  HENT:  Positive for congestion and sore throat.   Respiratory:  Positive for cough. Negative for chest tightness, shortness of breath, wheezing and stridor.   Cardiovascular: Negative.   Gastrointestinal: Negative.   Genitourinary: Negative.   Neurological: Negative.     Physical Exam Triage Vital Signs ED Triage Vitals  Enc Vitals Group     BP 09/21/21 1812 137/80     Pulse Rate 09/21/21 1812 Marland Kitchen)  110     Resp 09/21/21 1812 18     Temp 09/21/21 1812 99 F (37.2 C)     Temp Source 09/21/21 1812 Oral     SpO2 09/21/21 1812 94 %     Weight --      Height --      Head Circumference --      Peak Flow --      Pain Score 09/21/21 1813 5     Pain Loc --      Pain Edu? --      Excl. in [place]? --    No data found.  Updated Vital Signs BP 137/80 (BP Location: Left Arm)   Pulse (!) 110   Temp 99 F (37.2 C) (Oral)   Resp 18   SpO2 94%   Visual Acuity Right Eye Distance:   Left Eye Distance:   Bilateral Distance:    Right Eye Near:   Left Eye Near:    Bilateral Near:     Physical Exam Vitals and nursing note reviewed.  Constitutional:      General: She is not in acute distress.    Appearance: She is not ill-appearing.  HENT:     Right Ear: Tympanic membrane normal.     Left Ear: Tympanic membrane normal.     Mouth/Throat:     Mouth: Mucous membranes are moist.  Cardiovascular:     Rate and  Rhythm: Normal rate and regular rhythm.     Pulses: Normal pulses.     Heart sounds: Normal heart sounds.  Pulmonary:     Effort: Pulmonary effort is normal.     Breath sounds: Normal breath sounds.  Abdominal:     General: Bowel sounds are normal.     Palpations: Abdomen is soft.  Neurological:     Mental Status: She is alert.     UC Treatments / Results  Labs (all labs ordered are listed, but only abnormal results are displayed) Labs Reviewed  SARS CORONAVIRUS 2 (TAT 6-24 HRS)    EKG   Radiology No results found.  Procedures Procedures (including critical care time)  Medications Ordered in UC Medications - No data to display  Initial Impression / Assessment and Plan / UC Course  I have reviewed the triage vital signs and the nursing notes.  Pertinent labs & imaging results that were available during my care of the patient were reviewed by me and considered in my medical decision making (see chart for details).     1.  Acute viral pharyngitis: COVID-19 PCR test has been sent Cervical lozenges Tessalon Perles as needed for cough Maintain adequate hydration Saline nasal spray Zyrtec 10 mg orally daily Return to urgent care if symptoms worsen We will call you with recommendations if labs are abnormal. Final Clinical Impressions(s) / UC Diagnoses   Final diagnoses:  Acute pharyngitis, unspecified etiology     Discharge Instructions      Please take your medications as prescribed Please quarantine until COVID-19 test results available Increase oral fluid intake Return to urgent care if symptoms worsen We will call you with recommendations if labs are abnormal.   ED Prescriptions     Medication Sig Dispense Auth. Provider   benzonatate (TESSALON) 100 MG capsule Take 1 capsule (100 mg total) by mouth 3 (three) times daily as needed for cough. 21 capsule Susan Bleich, Britta Mccreedy, MD   sodium chloride (OCEAN) 0.65 % SOLN nasal spray Place 1 spray into both  nostrils as needed  for congestion. -- Merrilee Jansky, MD   ibuprofen (ADVIL) 600 MG tablet Take 1 tablet (600 mg total) by mouth every 6 (six) hours as needed. 30 tablet Caci Orren, Britta Mccreedy, MD   Cetirizine HCl 10 MG CAPS Take 1 capsule (10 mg total) by mouth daily for 15 days. 15 capsule Kadisha Goodine, Britta Mccreedy, MD      PDMP not reviewed this encounter.   Merrilee Jansky, MD 09/22/21 1210

## 2021-11-26 ENCOUNTER — Encounter (HOSPITAL_COMMUNITY): Payer: Self-pay | Admitting: Emergency Medicine

## 2021-11-26 ENCOUNTER — Ambulatory Visit (HOSPITAL_COMMUNITY)
Admission: EM | Admit: 2021-11-26 | Discharge: 2021-11-26 | Disposition: A | Discharge status: Home / Self Care | Payer: Self-pay | Attending: Emergency Medicine | Admitting: Emergency Medicine

## 2021-11-26 ENCOUNTER — Other Ambulatory Visit: Payer: Self-pay

## 2021-11-26 DIAGNOSIS — Z20822 Contact with and (suspected) exposure to covid-19: Secondary | ICD-10-CM | POA: Insufficient documentation

## 2021-11-26 DIAGNOSIS — J069 Acute upper respiratory infection, unspecified: Secondary | ICD-10-CM

## 2021-11-26 DIAGNOSIS — J0100 Acute maxillary sinusitis, unspecified: Secondary | ICD-10-CM

## 2021-11-26 LAB — POC INFLUENZA A AND B ANTIGEN (URGENT CARE ONLY)
INFLUENZA A ANTIGEN, POC: NEGATIVE
INFLUENZA B ANTIGEN, POC: NEGATIVE

## 2021-11-26 MED ORDER — IPRATROPIUM BROMIDE 0.06 % NA SOLN: 2.0000 | Freq: Four times a day (QID) | NASAL | 12 refills | Status: DC

## 2021-11-26 NOTE — ED Triage Notes (Signed)
Pt presents with sore throat, cough, body aches, and ear pain xs 5 days.

## 2021-11-26 NOTE — Discharge Instructions (Addendum)
Your symptoms are most consistent with a viral upper respiratory illness.  Rapid influenza testing today was negative.  The results of your respiratory virus testing will be posted to your MyChart.  If any of the results are positive, you will be contacted by phone and further recommendations, if needed, will be provided to you.  I provided you with a prescription for Atrovent nasal spray, this spray will significantly reduce the swelling and drainage from your sinuses, reduce your cough and alleviate the pressure in your ears.  Please spray 2 sprays in each nare up to 4 times daily as needed.  Please remain home from work, school, public places until you have been fever free for 24 hours without the use of antifever medications such as Tylenol or ibuprofen.  Conservative care is recommended at this time.  This includes rest, pushing clear fluids and activity as tolerated.  You may also noticed that your appetite is reduced, this is okay as long as they are drinking plenty of clear fluids.  Acetaminophen (Tylenol): This is a good fever reducer.  If there body temperature rises above 101.5 as measured with a thermometer, it is recommended that you give them 1,000 mg every 6-8 hours until they are temperature falls below 101.5, please not take more than 3,000 mg of acetaminophen either as a separate medication or as in ingredient in an over-the-counter cold/flu preparation within a 24-hour period  Ibuprofen  (Advil, Motrin): This is a good anti-inflammatory medication which addresses aches and pains and, to some degree, congestion in the nasal passages.  I recommend giving between 400 to 600 mg every 6-8 hours as needed.  Pseudoephedrine (Sudafed): This is a decongestant.  This medication has to be purchased from the pharmacist counter, I recommend giving 2 tablets, 60 mg, 2-3 times a day as needed to relieve runny nose and sinus drainage.  Guaifenesin (Robitussin, Mucinex): This is an expectorant.  This  helps break up chest congestion and loosen up thick nasal drainage making phlegm and drainage more liquid and therefore easier to remove.  I recommend being 400 mg three times daily as needed.  Dextromethorphan (any cough medicine with the letters "DM" added to it's name such as Robitussin DM): This is a cough suppressant.  This is often recommended to be taken at nighttime to suppress cough and help children sleep.  Give dosage as directed on the bottle.   Chloraseptic Throat Spray: Spray 5 sprays into affected area every 2 hours, hold for 15 seconds and either swallow or spit it out.  This is a excellent numbing medication because it is a spray, you can put it right where you needed and so sucking on a lozenge and numbing your entire mouth.  Based on my physical exam findings and the history provided  today, I do not see any evidence of bacterial infection therefore treatment with antibiotics would be of no benefit.  Please follow-up within the next 3 to 5 days either with your primary care provider or urgent care if your symptoms do not resolve.  If you do not have a primary care provider, we will assist you in finding one.

## 2021-11-26 NOTE — ED Provider Notes (Signed)
[facility]    CSN: [id number] Arrival date & time: 11/26/21  1021    HISTORY   Chief Complaint  Patient presents with   Cough   Generalized Body Aches   Sore Throat   Headache   Otalgia   HPI Rebecca Brewer is a 35 y.o. female. Pt presents with sore throat, nonproductive cough, congestion, rhinorrhea, body aches, and dull ear pain x 5 days.  Patient states symptoms waxed and waned, states that right now things do not smell or taste right but she is able to smell and taste.  Patient states she is tried some over-the-counter cough and cold preparations with some relief but symptoms usually return after a few hours.  The history is provided by the patient.  History reviewed. No pertinent past medical history. 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 Authorizing Provider  ipratropium (ATROVENT) 0.06 % nasal spray Place 2 sprays into both nostrils 4 (four) times daily. As needed for nasal congestion, runny nose 11/26/21  Yes Theadora Rama Scales, PA-C  benzonatate (TESSALON) 100 MG capsule Take 1 capsule (100 mg total) by mouth 3 (three) times daily as needed for cough. 09/21/21   Lamptey, Britta Mccreedy, MD  Cetirizine HCl 10 MG CAPS Take 1 capsule (10 mg total) by mouth daily for 15 days. 09/21/21 10/06/21  Merrilee Jansky, MD  FLUoxetine (PROZAC) 10 MG capsule Take 1 capsule (10 mg total) by mouth daily. 08/03/19   Yvette Rack, MD  ibuprofen (ADVIL) 600 MG tablet Take 1 tablet (600 mg  total) by mouth every 6 (six) hours as needed. 09/21/21   Merrilee Jansky, MD  sodium chloride (OCEAN) 0.65 % SOLN nasal spray Place 1 spray into both nostrils as needed for congestion. 09/21/21   Lamptey, Britta Mccreedy, MD   Family History Family History  Problem Relation Age of Onset   Hypertension Mother    Diabetes Mother    Breast cancer Mother    Social History Social History   Tobacco Use   Smoking status: Never   Smokeless tobacco: Never  Vaping Use   Vaping Use: Never used  Substance Use Topics   Alcohol use: Yes    Alcohol/week: 0.0 standard drinks    Comment: occasional   Drug use: No   Allergies   Patient has no known allergies.  Review of Systems Review of Systems Pertinent findings noted in history of present illness.   Physical Exam Triage Vital Signs ED Triage Vitals  Enc Vitals Group     BP 10/27/21 0827 (!) 147/82     Pulse Rate 10/27/21 0827 72     Resp 10/27/21 0827 18     Temp 10/27/21 0827 98.3 F (36.8  C)     Temp Source 10/27/21 0827 Oral     SpO2 10/27/21 0827 98 %     Weight --      Height --      Head Circumference --      Peak Flow --      Pain Score 10/27/21 0826 5     Pain Loc --      Pain Edu? --      Excl. in GC? --   No data found.  Updated Vital Signs BP 134/85 (BP Location: Left Arm)   Pulse 85   Temp 98.9 F (37.2 C) (Oral)   Resp 18   LMP 10/27/2021   SpO2 96%   Physical Exam Vitals and nursing note reviewed.  Constitutional:      General: She is not in acute distress.    Appearance: Normal appearance. She is not ill-appearing.  HENT:     Head: Normocephalic and atraumatic.     Salivary Glands: Right salivary gland is not diffusely enlarged or tender. Left salivary gland is not diffusely enlarged or tender.     Right Ear: Tympanic membrane, ear canal and external ear normal. No drainage. No middle ear effusion. There is no impacted cerumen. Tympanic membrane is not erythematous or bulging.     Left Ear: Tympanic  membrane, ear canal and external ear normal. No drainage.  No middle ear effusion. There is no impacted cerumen. Tympanic membrane is not erythematous or bulging.     Nose: Rhinorrhea present. No nasal deformity, septal deviation, mucosal edema or congestion. Rhinorrhea is clear.     Right Turbinates: Enlarged, swollen and pale.     Left Turbinates: Enlarged, swollen and pale.     Right Sinus: No maxillary sinus tenderness or frontal sinus tenderness.     Left Sinus: No maxillary sinus tenderness or frontal sinus tenderness.     Mouth/Throat:     Lips: Pink. No lesions.     Mouth: Mucous membranes are moist. No oral lesions.     Pharynx: Oropharynx is clear. Uvula midline. Posterior oropharyngeal erythema present. No uvula swelling.     Tonsils: No tonsillar exudate. 0 on the right. 0 on the left.  Eyes:     General: Lids are normal.        Right eye: No discharge.        Left eye: No discharge.     Extraocular Movements: Extraocular movements intact.     Conjunctiva/sclera: Conjunctivae normal.     Right eye: Right conjunctiva is not injected.     Left eye: Left conjunctiva is not injected.  Neck:     Trachea: Trachea and phonation normal.  Cardiovascular:     Rate and Rhythm: Normal rate and regular rhythm.     Pulses: Normal pulses.     Heart sounds: Normal heart sounds. No murmur heard.   No friction rub. No gallop.  Pulmonary:     Effort: Pulmonary effort is normal. No accessory muscle usage, prolonged expiration or respiratory distress.     Breath sounds: Normal breath sounds. No stridor, decreased air movement or transmitted upper airway sounds. No decreased breath sounds, wheezing, rhonchi or rales.  Chest:     Chest wall: No tenderness.  Musculoskeletal:        General: Normal range of motion.     Cervical back: Normal range of motion and neck supple. Normal range of motion.  Lymphadenopathy:     Cervical: Cervical adenopathy present.  Right cervical: Superficial  cervical adenopathy present.     Left cervical: Superficial cervical adenopathy present.  Skin:    General: Skin is warm and dry.     Findings: No erythema or rash.  Neurological:     General: No focal deficit present.     Mental Status: She is alert and oriented to person, place, and time.  Psychiatric:        Mood and Affect: Mood normal.        Behavior: Behavior normal.    Visual Acuity Right Eye Distance:   Left Eye Distance:   Bilateral Distance:    Right Eye Near:   Left Eye Near:    Bilateral Near:     UC Couse / Diagnostics / Procedures:    EKG  Radiology No results found.  Procedures Procedures (including critical care time)  UC Diagnoses / Final Clinical Impressions(s)   I have reviewed the triage vital signs and the nursing notes.  Pertinent labs & imaging results that were available during my care of the patient were reviewed by me and considered in my medical decision making (see chart for details).   Final diagnoses:  Upper respiratory tract infection, unspecified type  Acute maxillary sinusitis, recurrence not specified   Patient likely suffering from sinusitis, Atrovent nasal spray provided to relieve swelling and mucus production.  Patient tested for COVID as a precaution, advised to quarantine at home until results are received.  Note provided for work.  Return precautions advised.  Disposition Upon Discharge:  Condition: stable for discharge home Home: take medications as prescribed; routine discharge instructions as discussed; follow up as advised.  Patient presented with an acute illness with associated systemic symptoms and significant discomfort requiring urgent management. In my opinion, this is a condition that a prudent lay person (someone who possesses an average knowledge of health and medicine) may potentially expect to result in complications if not addressed urgently such as respiratory distress, impairment of bodily function or  dysfunction of bodily organs.   Routine symptom specific, illness specific and/or disease specific instructions were discussed with the patient and/or caregiver at length.   As such, the patient has been evaluated and assessed, work-up was performed and treatment was provided in alignment with urgent care protocols and evidence based medicine.  Patient/parent/caregiver has been advised that the patient may require follow up for further testing and treatment if the symptoms continue in spite of treatment, as clinically indicated and appropriate.  The patient was tested for COVID-19, Influenza and/or RSV, then the patient/parent/guardian was advised to isolate at home pending the results of his/her diagnostic coronavirus test and potentially longer if they're positive. I have also advised pt that if his/her COVID-19 test returns positive, it's recommended to self-isolate for at least 10 days after symptoms first appeared AND until fever-free for 24 hours without fever reducer AND other symptoms have improved or resolved. Discussed self-isolation recommendations as well as instructions for household member/close contacts as per the [facility] and [facility], and also gave patient the COVID packet with this information.  Rapid influenza test today was negative.  COVID test is pending.  Patient/parent/caregiver has been advised to return to the [facility] or PCP in 3-5 days if no better; to PCP or the Emergency Department if new signs and symptoms develop, or if the current signs or symptoms continue to change or worsen for further workup, evaluation and treatment as clinically indicated and appropriate  The patient will follow up with their current  PCP if and as advised. If the patient does not currently have a PCP we will assist them in obtaining one.   The patient may need specialty follow up if the symptoms continue, in spite of conservative treatment and management, for further workup, evaluation, consultation and  treatment as clinically indicated and appropriate.  Patient/parent/caregiver verbalized understanding and agreement of plan as discussed.  All questions were addressed during visit.  Please see discharge instructions below for further details of plan.  ED Prescriptions     Medication Sig Dispense Auth. Provider   ipratropium (ATROVENT) 0.06 % nasal spray Place 2 sprays into both nostrils 4 (four) times daily. As needed for nasal congestion, runny nose 15 mL Theadora Rama Scales, PA-C      PDMP not reviewed this encounter.  Pending results:  Labs Reviewed  SARS CORONAVIRUS 2 (TAT 6-24 HRS)  POC INFLUENZA A AND B ANTIGEN (URGENT CARE ONLY)    Medications Ordered in UC: Medications - No data to display  Discharge Instructions:   Discharge Instructions      Your symptoms are most consistent with a viral upper respiratory illness.  Rapid influenza testing today was negative.  The results of your respiratory virus testing will be posted to your MyChart.  If any of the results are positive, you will be contacted by phone and further recommendations, if needed, will be provided to you.  I provided you with a prescription for Atrovent nasal spray, this spray will significantly reduce the swelling and drainage from your sinuses, reduce your cough and alleviate the pressure in your ears.  Please spray 2 sprays in each nare up to 4 times daily as needed.  Please remain home from work, school, public places until you have been fever free for 24 hours without the use of antifever medications such as Tylenol or ibuprofen.  Conservative care is recommended at this time.  This includes rest, pushing clear fluids and activity as tolerated.  You may also noticed that your appetite is reduced, this is okay as long as they are drinking plenty of clear fluids.  Acetaminophen (Tylenol): This is a good fever reducer.  If there body temperature rises above 101.5 as measured with a thermometer, it is  recommended that you give them 1,000 mg every 6-8 hours until they are temperature falls below 101.5, please not take more than 3,000 mg of acetaminophen either as a separate medication or as in ingredient in an over-the-counter cold/flu preparation within a 24-hour period  Ibuprofen  (Advil, Motrin): This is a good anti-inflammatory medication which addresses aches and pains and, to some degree, congestion in the nasal passages.  I recommend giving between 400 to 600 mg every 6-8 hours as needed.  Pseudoephedrine (Sudafed): This is a decongestant.  This medication has to be purchased from the pharmacist counter, I recommend giving 2 tablets, 60 mg, 2-3 times a day as needed to relieve runny nose and sinus drainage.  Guaifenesin (Robitussin, Mucinex): This is an expectorant.  This helps break up chest congestion and loosen up thick nasal drainage making phlegm and drainage more liquid and therefore easier to remove.  I recommend being 400 mg three times daily as needed.  Dextromethorphan (any cough medicine with the letters "DM" added to it's name such as Robitussin DM): This is a cough suppressant.  This is often recommended to be taken at nighttime to suppress cough and help children sleep.  Give dosage as directed on the bottle.   Chloraseptic Throat Spray: Spray  5 sprays into affected area every 2 hours, hold for 15 seconds and either swallow or spit it out.  This is a excellent numbing medication because it is a spray, you can put it right where you needed and so sucking on a lozenge and numbing your entire mouth.  Based on my physical exam findings and the history provided  today, I do not see any evidence of bacterial infection therefore treatment with antibiotics would be of no benefit.  Please follow-up within the next 3 to 5 days either with your primary care provider or urgent care if your symptoms do not resolve.  If you do not have a primary care provider, we will assist you in finding  one.         Theadora Rama Scales, PA-C 11/26/21 1216

## 2021-11-27 LAB — SARS CORONAVIRUS 2 (TAT 6-24 HRS): SARS Coronavirus 2: NEGATIVE

## 2022-02-13 ENCOUNTER — Other Ambulatory Visit: Payer: Self-pay

## 2022-02-13 ENCOUNTER — Encounter (HOSPITAL_COMMUNITY): Payer: Self-pay | Admitting: Emergency Medicine

## 2022-02-13 ENCOUNTER — Ambulatory Visit (HOSPITAL_COMMUNITY)
Admission: EM | Admit: 2022-02-13 | Discharge: 2022-02-13 | Disposition: A | Discharge status: Home / Self Care | Payer: Self-pay | Attending: Internal Medicine | Admitting: Internal Medicine

## 2022-02-13 DIAGNOSIS — J069 Acute upper respiratory infection, unspecified: Secondary | ICD-10-CM | POA: Insufficient documentation

## 2022-02-13 DIAGNOSIS — Z20822 Contact with and (suspected) exposure to covid-19: Secondary | ICD-10-CM | POA: Insufficient documentation

## 2022-02-13 LAB — POC INFLUENZA A AND B ANTIGEN (URGENT CARE ONLY)
INFLUENZA A ANTIGEN, POC: NEGATIVE
INFLUENZA B ANTIGEN, POC: NEGATIVE

## 2022-02-13 LAB — POCT RAPID STREP A, ED / UC: Streptococcus, Group A Screen (Direct): NEGATIVE

## 2022-02-13 MED ORDER — ACETAMINOPHEN 325 MG PO TABS
ORAL_TABLET | ORAL | Status: AC
  Filled 2022-02-13: qty 3

## 2022-02-13 MED ORDER — BENZONATATE 200 MG PO CAPS: 200.0000 mg | ORAL_CAPSULE | Freq: Two times a day (BID) | ORAL | 0 refills | Status: DC | PRN

## 2022-02-13 MED ORDER — GUAIFENESIN-CODEINE 100-10 MG/5ML PO SOLN: 5.0000 mL | Freq: Every evening | ORAL | 0 refills | Status: DC | PRN

## 2022-02-13 MED ORDER — ACETAMINOPHEN 325 MG PO TABS
975.0000 mg | ORAL_TABLET | Freq: Once | ORAL | Status: AC
  Administered 2022-02-13: 975 mg via ORAL

## 2022-02-13 NOTE — ED Triage Notes (Signed)
Pt reports nasal congestion, sore throat and cough x 4 days.

## 2022-02-13 NOTE — ED Provider Notes (Signed)
[facility]    CSN: [id number] Arrival date & time: 02/13/22  1812      History   Chief Complaint Chief Complaint  Patient presents with   URI    HPI Rebecca Brewer is a 36 y.o. female who presents with onset of nose congestion, ST HA and cough x 4 days. Her cough is getting worse. She has lost her appetite. Has been sweating. Has continued working. Denies fever. Has had mild chills.     History reviewed. No pertinent past medical history.  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 Authorizing Provider  benzonatate (TESSALON) 200 MG capsule Take 1 capsule (200 mg total) by mouth 2 (two) times daily as needed for cough. 02/13/22  Yes Rodriguez-Southworth, Nettie Elm, PA-C  guaiFENesin-codeine 100-10 MG/5ML syrup Take 5 mLs by mouth at bedtime as needed for cough. 02/13/22  Yes Rodriguez-Southworth, Nettie Elm, PA-C  Cetirizine HCl 10 MG CAPS Take 1 capsule (10 mg total) by mouth daily for 15 days. 09/21/21 10/06/21  Merrilee Jansky, MD  FLUoxetine (PROZAC) 10 MG capsule Take 1 capsule (10 mg total) by mouth daily. 08/03/19   Yvette Rack, MD  ibuprofen (ADVIL) 600 MG tablet Take 1 tablet (600 mg total) by mouth every 6 (six) hours as needed. 09/21/21   Merrilee Jansky, MD  ipratropium (ATROVENT) 0.06 % nasal spray Place 2 sprays into both nostrils 4 (four) times daily. As needed for nasal congestion, runny nose 11/26/21   Theadora Rama Scales, PA-C  sodium chloride (OCEAN) 0.65 % SOLN  nasal spray Place 1 spray into both nostrils as needed for congestion. 09/21/21   Lamptey, Britta Mccreedy, MD    Family History Family History  Problem Relation Age of Onset   Hypertension Mother    Diabetes Mother    Breast cancer Mother     Social History Social History   Tobacco Use   Smoking status: Never   Smokeless tobacco: Never  Vaping Use   Vaping Use: Never used  Substance Use Topics   Alcohol use: Yes    Alcohol/week: 0.0 standard drinks    Comment: occasional   Drug use: No     Allergies   Patient has no known allergies.   Review of Systems Review of Systems  Constitutional:  Positive for appetite change, chills and diaphoresis. Negative for activity change, fatigue and fever.  HENT:  Positive for congestion, postnasal drip, rhinorrhea and sore throat. Negative for ear discharge and ear pain.   Eyes:  Negative for discharge.  Respiratory:  Positive for cough.   Musculoskeletal:  Negative for myalgias.  Neurological:  Positive for headaches.    Physical Exam Triage Vital Signs ED Triage Vitals  Enc Vitals Group     BP 02/13/22 1910 (!) 141/90     Pulse Rate 02/13/22 1910 (!) 113     Resp 02/13/22 1910 18     Temp 02/13/22 1910 100.1 F (37.8 C)     Temp Source 02/13/22 1910 Oral     SpO2 02/13/22 1910 98 %     Weight 02/13/22 1911 (!) 303 lb 5.7 oz (137.6 kg)     Height 02/13/22 1911 5\' 1"  (1.549 m)     Head Circumference --      Peak Flow --      Pain Score 02/13/22 1911 0     Pain Loc --      Pain Edu? --      Excl. in GC? --    No data found.  Updated Vital Signs BP (!) 141/90 (BP Location: Right Arm)    Pulse (!) 113    Temp 100.1 F (37.8 C) (Oral)    Resp 18    Ht 5\' 1"  (1.549 m)    Wt (!) 303 lb 5.7 oz (137.6 kg)    SpO2 98%    BMI 57.32 kg/m   Visual Acuity Right Eye Distance:   Left Eye Distance:   Bilateral Distance:    Right Eye Near:   Left Eye Near:    Bilateral Near:      Physical Exam Vitals signs and nursing note  reviewed.  Constitutional:      General: She is not in acute distress.    Appearance: Normal appearance. She is not ill-appearing, toxic-appearing or diaphoretic.  HENT:     Head: Normocephalic.     Right Ear: Tympanic membrane, ear canal and external ear normal.     Left Ear: Tympanic membrane, ear canal and external ear normal.     Nose: Nose normal.     Mouth/Throat: mild erythema    Mouth: Mucous membranes are moist.  Eyes:     General: No scleral icterus.       Right eye: No discharge.        Left eye: No discharge.     Conjunctiva/sclera: Conjunctivae normal.  Neck:     Musculoskeletal: Neck supple. No neck rigidity.  Cardiovascular:     Rate and Rhythm: Normal rate and regular rhythm.     Heart sounds: No murmur.  Pulmonary: has intermittent coughing specially when talking    Effort: Pulmonary effort is normal.     Breath sounds: Normal breath sounds.  AMusculoskeletal: Normal range of motion.  Lymphadenopathy:     Cervical: No cervical adenopathy.  Skin:    General: Skin is warm and dry.     Coloration: Skin is not jaundiced.     Findings: No rash.  Neurological:     Mental Status: She is alert and oriented to person, place, and time.     Gait: Gait normal.  Psychiatric:        Mood and Affect: Mood normal.        Behavior: Behavior normal.        Thought Content: Thought content normal.        Judgment: Judgment normal.    UC Treatments / Results  Labs (all labs ordered are listed, but only abnormal results are displayed) Labs Reviewed  SARS CORONAVIRUS 2 (TAT 6-24 HRS)  POCT RAPID STREP A, ED / UC  POC INFLUENZA A AND B ANTIGEN (URGENT CARE ONLY)  Flu A&B neg PCR strep neg.  EKG  Radiology No results found.  Procedures Procedures (including critical care time)  Medications Ordered in UC Medications  acetaminophen (TYLENOL) tablet 975 mg (has no administration in time range)    Initial Impression / Assessment and Plan / UC Course  I have  reviewed the triage vital signs and the nursing notes.  Pertinent labs results that were available during my care of the patient were reviewed by me and considered in my medical decision making (see chart for details). Viral URI Covid test pending and we will inform her if positive I placed her on Tessalon for day time cough as noted and Robitussin AC qhs as noted.  I asked her to monitor her temp, and if the Covid test is negative and she gets worse she needs to be seen again.     Final Clinical Impressions(s) / UC Diagnoses   Final diagnoses:  Viral URI with cough   Discharge Instructions   None    ED Prescriptions     Medication Sig Dispense Auth. Provider   benzonatate (TESSALON) 200 MG capsule Take 1 capsule (200 mg total) by mouth 2 (two) times daily as needed for cough. 30 capsule Rodriguez-Southworth, Alijah Akram, PA-C   guaiFENesin-codeine 100-10 MG/5ML syrup Take 5 mLs by mouth at bedtime as needed for cough. 120 mL Rodriguez-Southworth, Nettie Elm, PA-C      I have reviewed the PDMP during this encounter.   Garey Ham, Cordelia Poche 02/13/22 2007

## 2022-02-14 LAB — SARS CORONAVIRUS 2 (TAT 6-24 HRS): SARS Coronavirus 2: NEGATIVE

## 2022-02-16 LAB — CULTURE, GROUP A STREP (THRC)

## 2024-07-17 ENCOUNTER — Encounter (HOSPITAL_COMMUNITY): Payer: Self-pay

## 2024-07-17 ENCOUNTER — Ambulatory Visit (HOSPITAL_COMMUNITY)
Admission: EM | Admit: 2024-07-17 | Discharge: 2024-07-17 | Disposition: A | Discharge status: Home / Self Care | Attending: Family Medicine | Admitting: Family Medicine

## 2024-07-17 DIAGNOSIS — K529 Noninfective gastroenteritis and colitis, unspecified: Secondary | ICD-10-CM | POA: Diagnosis not present

## 2024-07-17 HISTORY — DX: Unspecified asthma, uncomplicated: J45.909

## 2024-07-17 HISTORY — DX: Malignant (primary) neoplasm, unspecified: C80.1

## 2024-07-17 HISTORY — DX: Disorder of thyroid, unspecified: E07.9

## 2024-07-17 MED ORDER — DICYCLOMINE HCL 20 MG PO TABS: 20.0000 mg | ORAL_TABLET | Freq: Four times a day (QID) | ORAL | 0 refills | Status: AC | PRN

## 2024-07-17 MED ORDER — ONDANSETRON 4 MG PO TBDP
ORAL_TABLET | ORAL | Status: AC
  Filled 2024-07-17: qty 1

## 2024-07-17 MED ORDER — ONDANSETRON 4 MG PO TBDP
4.0000 mg | ORAL_TABLET | Freq: Once | ORAL | Status: AC
  Administered 2024-07-17: 4 mg via ORAL

## 2024-07-17 MED ORDER — ONDANSETRON 4 MG PO TBDP: 4.0000 mg | ORAL_TABLET | Freq: Three times a day (TID) | ORAL | 0 refills | Status: DC | PRN

## 2024-07-17 NOTE — Discharge Instructions (Signed)
 Ondansetron  dissolved in the mouth every 8 hours as needed for nausea or vomiting. Clear liquids(water, gatorade/pedialyte, ginger ale/sprite, chicken broth/soup) and bland things(crackers/toast, rice, potato, bananas) to eat. Avoid acidic foods like lemon/lime/orange/tomato, and avoid greasy/spicy foods.  We gave you 1 dose of that medication here in the office  Dicyclomine--take 1 every 6 hours as needed for intestinal cramps  If these medications do not provide relief or if you worsen in any way, please go to emergency room for further evaluation

## 2024-07-17 NOTE — ED Provider Notes (Addendum)
 [facility]    CSN: [id number] Arrival date & time: 07/17/24  1320      History   Chief Complaint Chief Complaint  Patient presents with   Abdominal Pain   Emesis   Diarrhea    HPI Rebecca Brewer is a 38 y.o. female.    Abdominal Pain Associated symptoms: diarrhea and vomiting   Emesis Associated symptoms: abdominal pain and diarrhea   Diarrhea Associated symptoms: abdominal pain and vomiting    Here for abdominal pain with n/v/d. Began yesterday.   Has had some watery diarrhea, and several episodes of vomiting.  Last bowel movement was earlier this afternoon. No blood in any output and no coffee-ground emesis.  Has maybe had some chills and sweats.   NKDA  Has an IUD  The pain is in the LUQ. No dysuria or hematuria.  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 Authorizing Provider  dicyclomine (BENTYL) 20 MG tablet Take 1 tablet (20 mg total) by mouth 4 (four) times daily as needed (intestinal cramps). 07/17/24  Yes Hamza Empson K, MD  levothyroxine (SYNTHROID) 200 MCG tablet Take 200 mcg by mouth daily before breakfast.   Yes [provider]  ondansetron  (ZOFRAN -ODT) 4 MG disintegrating tablet Take 1 tablet (4 mg total) by mouth every 8 (eight) hours as needed for nausea or vomiting. 07/17/24  Yes Henrik Orihuela, Sharlet POUR, MD  Cetirizine  HCl 10  MG CAPS Take 1 capsule (10 mg total) by mouth daily for 15 days. 09/21/21 10/06/21  Blaise Aleene KIDD, MD  FLUoxetine  (PROZAC ) 10 MG capsule Take 1 capsule (10 mg total) by mouth daily. Patient not taking: Reported on 07/17/2024 08/03/19   Agyei, Obed K, MD    Family History Family History  Problem Relation Age of Onset   Hypertension Mother    Diabetes Mother    Breast cancer Mother     Social History Social History   Tobacco Use   Smoking status: Never   Smokeless tobacco: Never  Vaping Use   Vaping status: Never Used  Substance Use Topics   Alcohol use: Yes    Alcohol/week: 0.0 standard drinks of alcohol    Comment: occasional   Drug use: No     Allergies   Patient has no known allergies.   Review of Systems Review of Systems  Gastrointestinal:  Positive for abdominal pain, diarrhea and vomiting.     Physical Exam Triage Vital Signs ED Triage Vitals  Encounter Vitals Group     BP 07/17/24 1440 113/75  Girls Systolic BP Percentile --      Girls Diastolic BP Percentile --      Boys Systolic BP Percentile --      Boys Diastolic BP Percentile --      Pulse Rate 07/17/24 1440 (!) 107     Resp 07/17/24 1440 16     Temp 07/17/24 1440 98.9 F (37.2 C)     Temp Source 07/17/24 1440 Oral     SpO2 07/17/24 1440 95 %     Weight --      Height --      Head Circumference --      Peak Flow --      Pain Score 07/17/24 1427 9     Pain Loc --      Pain Education --      Exclude from Growth Chart --    No data found.  Updated Vital Signs BP 113/75 (BP Location: Left Arm)   Pulse (!) 107   Temp 98.9 F (37.2 C) (Oral)   Resp 16   SpO2 95%   Visual Acuity Right Eye Distance:   Left Eye Distance:   Bilateral Distance:    Right Eye Near:   Left Eye Near:    Bilateral Near:     Physical Exam Vitals reviewed.  Constitutional:      General: She is not in acute distress.    Appearance: She is not toxic-appearing.  HENT:     Mouth/Throat:     Mouth:  Mucous membranes are moist.     Pharynx: No oropharyngeal exudate or posterior oropharyngeal erythema.  Eyes:     Extraocular Movements: Extraocular movements intact.     Conjunctiva/sclera: Conjunctivae normal.     Pupils: Pupils are equal, round, and reactive to light.  Cardiovascular:     Rate and Rhythm: Normal rate and regular rhythm.     Heart sounds: No murmur heard. Pulmonary:     Effort: Pulmonary effort is normal. No respiratory distress.     Breath sounds: No stridor. No wheezing, rhonchi or rales.  Abdominal:     General: Bowel sounds are normal. There is no distension.     Tenderness: There is no guarding.     Comments: There is generalized tenderness though somewhat worse in the left upper quadrant.  Musculoskeletal:     Cervical back: Neck supple.  Lymphadenopathy:     Cervical: No cervical adenopathy.  Skin:    Capillary Refill: Capillary refill takes less than 2 seconds.     Coloration: Skin is not jaundiced or pale.  Neurological:     General: No focal deficit present.     Mental Status: She is alert and oriented to person, place, and time.  Psychiatric:        Behavior: Behavior normal.      UC Treatments / Results  Labs (all labs ordered are listed, but only abnormal results are displayed) Labs Reviewed - No data to display  EKG   Radiology No results found.  Procedures Procedures (including critical care time)  Medications Ordered in UC Medications  ondansetron  (ZOFRAN -ODT) disintegrating tablet 4 mg (4 mg Oral Given 07/17/24 1435)    Initial Impression / Assessment and Plan / UC Course  I have reviewed the triage vital signs and the nursing notes.  Pertinent labs & imaging results that were available during my care of the patient were reviewed by me and considered in my medical decision making (see chart for details).  Zofran  is given here and Zofran  sent to pharmacy.  Bentyl is also sent for intestinal cramps.  She will go to the  emergency room if not improving or if worse Final Clinical Impressions(s) / UC Diagnoses   Final diagnoses:  Gastroenteritis     Discharge Instructions      Ondansetron  dissolved in the mouth every 8 hours as needed for nausea or vomiting. Clear liquids(water, gatorade/pedialyte, ginger ale/sprite, chicken broth/soup) and bland things(crackers/toast, rice, potato, bananas) to eat. Avoid acidic foods like lemon/lime/orange/tomato, and avoid greasy/spicy foods.  We gave you 1 dose of that medication here in the office  Dicyclomine--take 1 every 6 hours as needed for intestinal cramps  If these medications do not provide relief or if you worsen in any way, please go to emergency room for further evaluation     ED Prescriptions     Medication Sig Dispense Auth. Provider   ondansetron  (ZOFRAN -ODT) 4 MG disintegrating tablet Take 1 tablet (4 mg total) by mouth every 8 (eight) hours as needed for nausea or vomiting. 10 tablet Aayliah Rotenberry K, MD   dicyclomine (BENTYL) 20 MG tablet Take 1 tablet (20 mg total) by mouth 4 (four) times daily as needed (intestinal cramps). 20 tablet Miliano Cotten K, MD      PDMP not reviewed this encounter.   Vonna Sharlet POUR, MD 07/17/24 1534    Vonna Sharlet POUR, MD 07/17/24 [phone]

## 2024-07-17 NOTE — ED Triage Notes (Signed)
 Patient reports that she has had N/V/D and mid upper abdominal pain.  Patient states she has been trying to drink ginger ale and eat crackers.

## 2024-11-09 ENCOUNTER — Ambulatory Visit (HOSPITAL_COMMUNITY)
Admission: EM | Admit: 2024-11-09 | Discharge: 2024-11-09 | Disposition: A | Discharge status: Home / Self Care | Attending: Physician Assistant | Admitting: Physician Assistant

## 2024-11-09 ENCOUNTER — Encounter (HOSPITAL_COMMUNITY): Payer: Self-pay

## 2024-11-09 DIAGNOSIS — R5383 Other fatigue: Secondary | ICD-10-CM | POA: Insufficient documentation

## 2024-11-09 DIAGNOSIS — R0981 Nasal congestion: Secondary | ICD-10-CM | POA: Diagnosis not present

## 2024-11-09 DIAGNOSIS — Z79899 Other long term (current) drug therapy: Secondary | ICD-10-CM | POA: Diagnosis not present

## 2024-11-09 DIAGNOSIS — J069 Acute upper respiratory infection, unspecified: Secondary | ICD-10-CM | POA: Diagnosis present

## 2024-11-09 DIAGNOSIS — J029 Acute pharyngitis, unspecified: Secondary | ICD-10-CM | POA: Insufficient documentation

## 2024-11-09 DIAGNOSIS — R509 Fever, unspecified: Secondary | ICD-10-CM | POA: Diagnosis not present

## 2024-11-09 DIAGNOSIS — B9789 Other viral agents as the cause of diseases classified elsewhere: Secondary | ICD-10-CM | POA: Diagnosis not present

## 2024-11-09 DIAGNOSIS — R5381 Other malaise: Secondary | ICD-10-CM | POA: Insufficient documentation

## 2024-11-09 DIAGNOSIS — J45909 Unspecified asthma, uncomplicated: Secondary | ICD-10-CM | POA: Insufficient documentation

## 2024-11-09 DIAGNOSIS — Z8616 Personal history of COVID-19: Secondary | ICD-10-CM | POA: Insufficient documentation

## 2024-11-09 DIAGNOSIS — Z793 Long term (current) use of hormonal contraceptives: Secondary | ICD-10-CM | POA: Insufficient documentation

## 2024-11-09 DIAGNOSIS — R6883 Chills (without fever): Secondary | ICD-10-CM | POA: Insufficient documentation

## 2024-11-09 DIAGNOSIS — Z79890 Hormone replacement therapy (postmenopausal): Secondary | ICD-10-CM | POA: Diagnosis not present

## 2024-11-09 LAB — POC COVID19/FLU A&B COMBO
Covid Antigen, POC: NEGATIVE
Influenza A Antigen, POC: NEGATIVE
Influenza B Antigen, POC: NEGATIVE

## 2024-11-09 LAB — POCT RAPID STREP A (OFFICE): Rapid Strep A Screen: NEGATIVE

## 2024-11-09 MED ORDER — ACETAMINOPHEN 325 MG PO TABS
650.0000 mg | ORAL_TABLET | Freq: Once | ORAL | Status: AC
  Administered 2024-11-09: 650 mg via ORAL

## 2024-11-09 MED ORDER — IBUPROFEN 800 MG PO TABS
ORAL_TABLET | ORAL | Status: AC
  Filled 2024-11-09: qty 1

## 2024-11-09 MED ORDER — BENZONATATE 200 MG PO CAPS: 200.0000 mg | ORAL_CAPSULE | Freq: Three times a day (TID) | ORAL | 0 refills | Status: DC | PRN

## 2024-11-09 MED ORDER — IBUPROFEN 600 MG PO TABS: 600.0000 mg | ORAL_TABLET | Freq: Three times a day (TID) | ORAL | 0 refills | Status: AC | PRN

## 2024-11-09 MED ORDER — ACETAMINOPHEN 325 MG PO TABS
ORAL_TABLET | ORAL | Status: AC
  Filled 2024-11-09: qty 2

## 2024-11-09 MED ORDER — IBUPROFEN 800 MG PO TABS
800.0000 mg | ORAL_TABLET | Freq: Once | ORAL | Status: AC
  Administered 2024-11-09: 800 mg via ORAL

## 2024-11-09 NOTE — ED Triage Notes (Signed)
 Patient here today with c/o nasal congestion, ST, body aches, chills, sweats, cough, headache, burning sensation in chest, loss of smell, and loss of appetite X 2 days. No known sick contacts but she does work in a retirement home. She has been taking Nyquil, Dayquil, and Sudafed with no relief. Has not taken anything today.

## 2024-11-09 NOTE — ED Provider Notes (Signed)
 [facility]    CSN: [id number] Arrival date & time: 11/09/24  1410      History   Chief Complaint Chief Complaint  Patient presents with   Nasal Congestion    HPI Rebecca Brewer is a 38 y.o. female.   Patient presents today with a 2-day history of URI symptoms.  She reports cough, fever, chills, congestion, fatigue/malaise.  She denies any nausea, vomiting, diarrhea, chest pain, shortness of breath.  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  She has not had influenza vaccine.  She did have initial COVID vaccines but has not had most recent booster.  She had COVID several years ago but has not had it more recently.  She has tried DayQuil and NyQuil without improvement of symptoms.  Denies any recent antibiotics or steroids.  She has asthma listed as a past medical problem on her chart but reports that she does not use an albuterol inhaler and has never been hospitalized for this.  She is confident that she is not pregnant.    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 Authorizing Provider  benzonatate  (TESSALON ) 200 MG capsule Take 1 capsule (200 mg total) by mouth 3 (three) times daily as needed for cough. 11/09/24  Yes Ifeoluwa Bartz K, PA-C  ibuprofen  (ADVIL ) 600 MG tablet  Take 1 tablet (600 mg total) by mouth every 8 (eight) hours as needed. 11/09/24  Yes Merton Wadlow K, PA-C  Vitamin D, Ergocalciferol, (DRISDOL) 1.25 MG (50000 UNIT) CAPS capsule Take 50,000 Units by mouth once a week. 11/02/24 11/02/25 Yes [provider]  dicyclomine  (BENTYL ) 20 MG tablet Take 1 tablet (20 mg total) by mouth 4 (four) times daily as needed (intestinal cramps). 07/17/24   Vonna Sharlet POUR, MD  FLUoxetine  (PROZAC ) 10 MG capsule Take 1 capsule (10 mg total) by mouth daily. Patient not taking: Reported on 07/17/2024 08/03/19   Agyei, Obed K, MD  levonorgestrel  (MIRENA ) 20 MCG/DAY IUD 1 each by Intrauterine route once.    [provider]  levothyroxine (SYNTHROID) 200 MCG tablet Take 200 mcg by mouth daily before breakfast.    [provider]  ondansetron  (ZOFRAN -ODT) 4 MG disintegrating tablet Take 1 tablet (4 mg total) by mouth every 8 (eight) hours as needed for nausea or vomiting. 07/17/24   Vonna Sharlet POUR, MD    Family History Family History  Problem Relation Age of Onset   Hypertension Mother  Diabetes Mother    Breast cancer Mother     Social History Social History   Tobacco Use   Smoking status: Never   Smokeless tobacco: Never  Vaping Use   Vaping status: Never Used  Substance Use Topics   Alcohol use: Yes    Alcohol/week: 0.0 standard drinks of alcohol    Comment: occasional   Drug use: No     Allergies   Patient has no known allergies.   Review of Systems Review of Systems  Constitutional:  Positive for activity change, appetite change, chills, fatigue and fever.  HENT:  Positive for congestion and sore throat. Negative for sinus pressure and sneezing.   Respiratory:  Positive for cough and chest tightness. Negative for shortness of breath and wheezing.   Cardiovascular:  Negative for chest pain.  Gastrointestinal:  Negative for diarrhea, nausea and vomiting.  Musculoskeletal:  Positive for arthralgias and myalgias.   Neurological:  Positive for headaches. Negative for dizziness and light-headedness.     Physical Exam Triage Vital Signs ED Triage Vitals  Encounter Vitals Group     BP 11/09/24 1452 128/85     Girls Systolic BP Percentile --      Girls Diastolic BP Percentile --      Boys Systolic BP Percentile --      Boys Diastolic BP Percentile --      Pulse Rate 11/09/24 1452 (!) 125     Resp 11/09/24 1452 16     Temp 11/09/24 1452 (!) 101.8 F (38.8 C)     Temp Source 11/09/24 1452 Oral     SpO2 11/09/24 1452 95 %     Weight --      Height --      Head Circumference --      Peak Flow --      Pain Score 11/09/24 1448 10     Pain Loc --      Pain Education --      Exclude from Growth Chart --    No data found.  Updated Vital Signs BP 128/85 (BP Location: Right Arm)   Pulse (!) 102   Temp 100.1 F (37.8 C) (Oral)   Resp 16   LMP 10/30/2024 (Exact Date)   SpO2 97%   Visual Acuity Right Eye Distance:   Left Eye Distance:   Bilateral Distance:    Right Eye Near:   Left Eye Near:    Bilateral Near:     Physical Exam Vitals reviewed.  Constitutional:      General: She is awake. She is not in acute distress.    Appearance: Normal appearance. She is well-developed. She is not ill-appearing.     Comments: Very pleasant female appears stated age in no acute distress sitting comfortably in exam room  HENT:     Head: Normocephalic and atraumatic.     Right Ear: Tympanic membrane, ear canal and external ear normal. Tympanic membrane is not erythematous or bulging.     Left Ear: Tympanic membrane, ear canal and external ear normal. Tympanic membrane is not erythematous or bulging.     Nose:     Right Sinus: No maxillary sinus tenderness or frontal sinus tenderness.     Left Sinus: No maxillary sinus tenderness or frontal sinus tenderness.     Mouth/Throat:     Pharynx: Uvula midline. Posterior oropharyngeal erythema present. No oropharyngeal exudate.     Tonsils: No tonsillar  exudate or tonsillar abscesses. 2+ on the right. 2+ on  the left.     Comments: Small stone appearing white nodule noted right tonsil Cardiovascular:     Rate and Rhythm: Regular rhythm. Tachycardia present.     Heart sounds: Normal heart sounds, S1 normal and S2 normal. No murmur heard. Pulmonary:     Effort: Pulmonary effort is normal.     Breath sounds: Normal breath sounds. No wheezing, rhonchi or rales.     Comments: Clear auscultation bilaterally Psychiatric:        Behavior: Behavior is cooperative.      UC Treatments / Results  Labs (all labs ordered are listed, but only abnormal results are displayed) Labs Reviewed  CULTURE, GROUP A STREP ([facility])  POC COVID19/FLU A&B COMBO  POCT RAPID STREP A (OFFICE)    EKG   Radiology No results found.  Procedures Procedures (including critical care time)  Medications Ordered in UC Medications  acetaminophen  (TYLENOL ) tablet 650 mg (650 mg Oral Given 11/09/24 1500)  ibuprofen  (ADVIL ) tablet 800 mg (800 mg Oral Given 11/09/24 1518)    Initial Impression / Assessment and Plan / UC Course  I have reviewed the triage vital signs and the nursing notes.  Pertinent labs & imaging results that were available during my care of the patient were reviewed by me and considered in my medical decision making (see chart for details).     Patient is well-appearing, and nontoxic.  She was initially febrile and tachycardic but this improved after antipyretics in clinic.  No evidence of acute infection on physical exam that would warrant initiation of antibiotics.  She does negative for COVID and flu we discussed that she likely has a different viral illness or sexually positive for COVID or flu but has a low viral load that is not detectable on her POC testing.  Will treat symptomatically.  She was encouraged to take ibuprofen  600 mg 3 times a day for pain relief.  We discussed that this should not be combined with additional NSAIDs including  aspirin, ibuprofen /Advil , naproxen/Aleve but she can use Tylenol  for breakthrough pain.  No indication for dose adjustment based on metabolic panel from 11/02/2024 with creatinine of 0.59 and eGFR greater than 90 mL/min.  She was given Tessalon  for cough.  Also recommended nasal saline/sinus rinses, humidifier, over-the-counter medicines for additional symptom relief.  She is to rest and drink plenty of fluid.  We discussed that if her symptoms are not improving within a few days or if she has any worsening symptoms including high fever not responding to antipyretics, shortness of breath, chest pain, nausea/vomiting interfere with oral intake, weakness she needs to be seen immediately.  Strict return precautions given.  Excuse note provided.  All questions answered to patient satisfaction.  Final Clinical Impressions(s) / UC Diagnoses   Final diagnoses:  Sore throat  Fever, unspecified  Viral URI with cough     Discharge Instructions      I am concerned that you have a virus.  You did test negative for COVID and flu today but it is possible that this was a false negative.  Take ibuprofen  600 mg 3 times a day to help with pain and fever.  Alternate this with acetaminophen /Tylenol  over-the-counter.  Take Tessalon  for cough.  Make sure that you are resting and drinking plenty of fluid.  Use a humidifier in your room.  If you are not feeling better within 3 to 5 days please return for reevaluation.  If anything worsens and you have high fever not responding to medication,  chest pain, shortness of breath, nausea/vomiting interfering with oral intake, weakness, worsening cough you need to be seen immediately.     ED Prescriptions     Medication Sig Dispense Auth. Provider   ibuprofen  (ADVIL ) 600 MG tablet Take 1 tablet (600 mg total) by mouth every 8 (eight) hours as needed. 30 tablet Toben Acuna K, PA-C   benzonatate  (TESSALON ) 200 MG capsule Take 1 capsule (200 mg total) by mouth 3 (three) times  daily as needed for cough. 20 capsule Samir Ishaq K, PA-C      PDMP not reviewed this encounter.   Sherrell Rocky POUR, PA-C 11/09/24 [id number]

## 2024-11-09 NOTE — Discharge Instructions (Signed)
 I am concerned that you have a virus.  You did test negative for COVID and flu today but it is possible that this was a false negative.  Take ibuprofen  600 mg 3 times a day to help with pain and fever.  Alternate this with acetaminophen /Tylenol  over-the-counter.  Take Tessalon  for cough.  Make sure that you are resting and drinking plenty of fluid.  Use a humidifier in your room.  If you are not feeling better within 3 to 5 days please return for reevaluation.  If anything worsens and you have high fever not responding to medication, chest pain, shortness of breath, nausea/vomiting interfering with oral intake, weakness, worsening cough you need to be seen immediately.

## 2024-11-11 ENCOUNTER — Ambulatory Visit (HOSPITAL_COMMUNITY): Payer: Self-pay

## 2024-11-11 LAB — CULTURE, GROUP A STREP (THRC)

## 2024-12-06 ENCOUNTER — Encounter (HOSPITAL_COMMUNITY): Payer: Self-pay

## 2024-12-06 ENCOUNTER — Ambulatory Visit (HOSPITAL_COMMUNITY): Admission: EM | Admit: 2024-12-06 | Discharge: 2024-12-06 | Disposition: A | Discharge status: Home / Self Care

## 2024-12-06 DIAGNOSIS — M5441 Lumbago with sciatica, right side: Secondary | ICD-10-CM

## 2024-12-06 MED ORDER — PREDNISONE 20 MG PO TABS: 40.0000 mg | ORAL_TABLET | Freq: Every day | ORAL | 0 refills | Status: AC

## 2024-12-06 MED ORDER — BACLOFEN 10 MG PO TABS: 10.0000 mg | ORAL_TABLET | Freq: Three times a day (TID) | ORAL | 0 refills | Status: AC

## 2024-12-06 NOTE — ED Provider Notes (Signed)
 [facility]    CSN: [id number] Arrival date & time: 12/06/24  0845      History   Chief Complaint Chief Complaint  Patient presents with   Leg Pain    HPI Rebecca Brewer is a 38 y.o. female.   Rebecca Brewer is a 38 y.o. female presenting for chief complaint of right-sided leg pain and numbness to the right thigh that started approximately 7 days ago.  Pain originated in the right lower back but she mostly feels a numbness and tingling/irritating feeling to the right thigh.  Symptoms are intermittent and come and go with certain position changes and movements.  Paresthesias have become more constant over the last 24 to 48 hours to the right upper outer thigh.  Pain is currently an 8 on a scale of 0-10 and worsened by movement/twisting. No fall, trauma, numbness or tingling, saddle paresthesia, changes to bowel or urinary habits, extremity weakness, previous surgeries to the low back.  Last menstrual cycle was on October 26 2024, she has an IUD and denies chance of pregnancy.  Denies recent steroid use in the last 90 days.  History of similar symptoms last year when it got cold that resulted in a sciatica diagnosis.  She has been using ibuprofen , Tylenol , and topical Voltaren gel without any relief of pain.   Leg Pain   Past Medical History:  Diagnosis Date   Asthma    Cancer [facility])    Thyroid disease     Patient Active Problem List   Diagnosis Date Noted   Carpal tunnel syndrome of left wrist 04/11/2020   Major depression 08/03/2019   Elevated blood pressure reading 08/03/2019    Past Surgical History:  Procedure Laterality Date   CESAREAN SECTION     THYROIDECTOMY      OB History     Gravida  3   Para  1   Term  1   Preterm      AB  2   Living  1      SAB      IAB  2   Ectopic      Multiple      Live Births  1            Home Medications    Prior to Admission medications   Medication Sig Start Date End Date Taking?  Authorizing Provider  baclofen  (LIORESAL ) 10 MG tablet Take 1 tablet (10 mg total) by mouth 3 (three) times daily. 12/06/24  Yes Enedelia Dorna HERO, FNP  dicyclomine  (BENTYL ) 20 MG tablet Take 1 tablet (20 mg total) by mouth 4 (four) times daily as needed (intestinal cramps). 07/17/24  Yes Vonna Sharlet POUR, MD  FLUoxetine  (PROZAC ) 10 MG capsule Take 1 capsule (10 mg total) by mouth daily. 08/03/19  Yes Agyei, Obed K, MD  ibuprofen  (ADVIL ) 600 MG tablet Take 1 tablet (600 mg total) by mouth every 8 (eight) hours as needed. 11/09/24  Yes Raspet, Erin K, PA-C  levonorgestrel  (MIRENA ) 20 MCG/DAY IUD 1 each by Intrauterine route once.   Yes [provider]  levothyroxine (SYNTHROID) 175 MCG tablet Take 175 mcg by mouth. 11/02/24 11/02/25 Yes [provider]  predniSONE  (DELTASONE ) 20 MG tablet Take 2 tablets (40 mg total) by mouth daily with breakfast for 5 days. 12/06/24 12/11/24 Yes StanhopeDorna HERO, FNP  Vitamin D, Ergocalciferol, (DRISDOL) 1.25 MG (50000 UNIT) CAPS capsule Take 50,000 Units by mouth once a week. 11/02/24 11/02/25 Yes [provider]  Family History Family History  Problem Relation Age of Onset   Hypertension Mother    Diabetes Mother    Breast cancer Mother     Social History Social History   Tobacco Use   Smoking status: Never   Smokeless tobacco: Never  Vaping Use   Vaping status: Never Used  Substance Use Topics   Alcohol use: Yes    Alcohol/week: 0.0 standard drinks of alcohol    Comment: occasional   Drug use: No     Allergies   Patient has no known allergies.   Review of Systems Review of Systems Per HPI  Physical Exam Triage Vital Signs ED Triage Vitals  Encounter Vitals Group     BP 12/06/24 0939 (!) 145/88     Girls Systolic BP Percentile --      Girls Diastolic BP Percentile --      Boys Systolic BP Percentile --      Boys Diastolic BP Percentile --      Pulse Rate 12/06/24 0939 71     Resp 12/06/24 0939 18      Temp 12/06/24 0939 98.5 F (36.9 C)     Temp Source 12/06/24 0939 Oral     SpO2 12/06/24 0939 96 %     Weight 12/06/24 0939 272 lb (123.4 kg)     Height 12/06/24 0939 5' 1 (1.549 m)     Head Circumference --      Peak Flow --      Pain Score 12/06/24 0936 8     Pain Loc --      Pain Education --      Exclude from Growth Chart --    No data found.  Updated Vital Signs BP (!) 145/88 (BP Location: Left Arm)   Pulse 71   Temp 98.5 F (36.9 C) (Oral)   Resp 18   Ht 5' 1 (1.549 m)   Wt 272 lb (123.4 kg)   LMP 10/26/2024 (Exact Date) Comment: from 10/27 - 11/16  SpO2 96%   BMI 51.39 kg/m   Visual Acuity Right Eye Distance:   Left Eye Distance:   Bilateral Distance:    Right Eye Near:   Left Eye Near:    Bilateral Near:     Physical Exam Vitals and nursing note reviewed.  Constitutional:      Appearance: She is not ill-appearing or toxic-appearing.  HENT:     Head: Normocephalic and atraumatic.     Right Ear: Hearing and external ear normal.     Left Ear: Hearing and external ear normal.     Nose: Nose normal.     Mouth/Throat:     Lips: Pink.  Eyes:     General: Lids are normal. Vision grossly intact. Gaze aligned appropriately.     Extraocular Movements: Extraocular movements intact.     Conjunctiva/sclera: Conjunctivae normal.  Pulmonary:     Effort: Pulmonary effort is normal.  Musculoskeletal:     Cervical back: Normal and neck supple.     Thoracic back: Normal.     Lumbar back: Tenderness present. No swelling, edema, deformity, signs of trauma, lacerations, spasms or bony tenderness. Normal range of motion. Negative right straight leg raise test and negative left straight leg raise test. No scoliosis.     Comments: Tenderness to palpation over the right SI joint and right sided paraspinous musculature of lumbar spine. Strength and sensation intact to bilateral upper and lower extremities (5/5). Moves all 4 extremities with normal coordination  voluntarily.  Ambulatory with steady gait without difficulty.  Skin:    General: Skin is warm and dry.     Capillary Refill: Capillary refill takes less than 2 seconds.     Findings: No rash.  Neurological:     General: No focal deficit present.     Mental Status: She is alert and oriented to person, place, and time. Mental status is at baseline.     Cranial Nerves: No dysarthria or facial asymmetry.  Psychiatric:        Mood and Affect: Mood normal.        Speech: Speech normal.        Behavior: Behavior normal.        Thought Content: Thought content normal.        Judgment: Judgment normal.      UC Treatments / Results  Labs (all labs ordered are listed, but only abnormal results are displayed) Labs Reviewed - No data to display  EKG   Radiology No results found.  Procedures Procedures (including critical care time)  Medications Ordered in UC Medications - No data to display  Initial Impression / Assessment and Plan / UC Course  I have reviewed the triage vital signs and the nursing notes.  Pertinent labs & imaging results that were available during my care of the patient were reviewed by me and considered in my medical decision making (see chart for details).   1.  Acute right sided low back pain with right-sided sciatica Evaluation suggests sciatic nerve pain.  No red flag signs/symptoms found on exam indicating need for referral to ED for further workup.  Deferred imaging based on atraumatic mechanism of injury.  Pain has not responded well to NSAIDs, therefore will treat with short course of steroid to be started today.  Muscle relaxer as needed for muscular involvement, drowsiness precautions discussed. Follow-up with orthopedics as needed, walking referral given.   Counseled patient on potential for adverse effects with medications prescribed/recommended today, strict ER and return-to-clinic precautions discussed, patient verbalized understanding.     Final Clinical Impressions(s) / UC Diagnoses   Final diagnoses:  Acute right-sided low back pain with right-sided sciatica     Discharge Instructions      Your back pain is caused by sciatic nerve inflammation.   Take prednisone  steroid pills as prescribed starting today.  2 pills- 40mg - once daily each morning with food/breakfast for 5 days Do not take ibuprofen /naproxen/other NSAIDs while taking steroid pills as this can cause upset stomach.   Take muscle relaxer as needed for muscle spasm, mostly take this at bedtime as this medicine can cause drowsiness.  Apply heat to the pulled muscle 20 minutes on 20 minutes off as needed, heat relaxes muscles.  Perform gentle exercises and stretches to area of tenderness.  I would like for you to rest, however I do not want you to avoid moving the area. Movement and stretching will help with healing.  Red flag symptoms to watch out for are numbness/tingling to the legs, weakness, loss of bowel/bladder control, and/or worsening pain that does not respond well to medicines.  Follow-up with your primary care provider or return to urgent care if your symptoms do not improve in the next 3 to 4 days with medications and interventions recommended today. If your symptoms are severe (red flag), please go to the emergency room.      ED Prescriptions     Medication Sig Dispense Auth. Provider   predniSONE  (DELTASONE ) 20 MG  tablet Take 2 tablets (40 mg total) by mouth daily with breakfast for 5 days. 10 tablet Enedelia Dorna HERO, FNP   baclofen  (LIORESAL ) 10 MG tablet Take 1 tablet (10 mg total) by mouth 3 (three) times daily. 30 each Enedelia Dorna HERO, FNP      PDMP not reviewed this encounter.   Enedelia Dorna HERO, [place] 12/06/24 1026

## 2024-12-06 NOTE — ED Triage Notes (Signed)
 Right leg pain onset 1 week ago. States this pain pops up during the colder months. Denies any recent falls or injuries. Pain starts in the outer hip and thigh, painful to touch.   Patient tried tylenol  and topical pain relief with no help.

## 2024-12-06 NOTE — Discharge Instructions (Signed)
 Your back pain is caused by sciatic nerve inflammation.   Take prednisone  steroid pills as prescribed starting today.  2 pills- 40mg - once daily each morning with food/breakfast for 5 days Do not take ibuprofen /naproxen/other NSAIDs while taking steroid pills as this can cause upset stomach.   Take muscle relaxer as needed for muscle spasm, mostly take this at bedtime as this medicine can cause drowsiness.  Apply heat to the pulled muscle 20 minutes on 20 minutes off as needed, heat relaxes muscles.  Perform gentle exercises and stretches to area of tenderness.  I would like for you to rest, however I do not want you to avoid moving the area. Movement and stretching will help with healing.  Red flag symptoms to watch out for are numbness/tingling to the legs, weakness, loss of bowel/bladder control, and/or worsening pain that does not respond well to medicines.  Follow-up with your primary care provider or return to urgent care if your symptoms do not improve in the next 3 to 4 days with medications and interventions recommended today. If your symptoms are severe (red flag), please go to the emergency room.

## 2025-01-23 ENCOUNTER — Other Ambulatory Visit: Payer: Self-pay

## 2025-01-23 ENCOUNTER — Encounter (HOSPITAL_COMMUNITY): Payer: Self-pay | Admitting: Emergency Medicine

## 2025-01-23 ENCOUNTER — Ambulatory Visit (HOSPITAL_COMMUNITY)

## 2025-01-23 ENCOUNTER — Ambulatory Visit (HOSPITAL_COMMUNITY): Admission: EM | Admit: 2025-01-23 | Discharge: 2025-01-23 | Disposition: A | Discharge status: Home / Self Care

## 2025-01-23 DIAGNOSIS — R051 Acute cough: Secondary | ICD-10-CM

## 2025-01-23 DIAGNOSIS — J209 Acute bronchitis, unspecified: Secondary | ICD-10-CM

## 2025-01-23 MED ORDER — BENZONATATE 100 MG PO CAPS: 100.0000 mg | ORAL_CAPSULE | Freq: Three times a day (TID) | ORAL | 0 refills | Status: AC | PRN

## 2025-01-23 MED ORDER — FLUTICASONE PROPIONATE 50 MCG/ACT NA SUSP: 2.0000 | Freq: Every day | NASAL | 0 refills | Status: AC

## 2025-01-23 MED ORDER — ALBUTEROL SULFATE HFA 108 (90 BASE) MCG/ACT IN AERS: 1.0000 | INHALATION_SPRAY | Freq: Four times a day (QID) | RESPIRATORY_TRACT | 0 refills | Status: AC | PRN

## 2025-01-23 MED ORDER — IPRATROPIUM-ALBUTEROL 0.5-2.5 (3) MG/3ML IN SOLN
RESPIRATORY_TRACT | Status: AC
  Filled 2025-01-23: qty 3

## 2025-01-23 MED ORDER — PREDNISONE 20 MG PO TABS: 40.0000 mg | ORAL_TABLET | Freq: Every day | ORAL | 0 refills | Status: AC

## 2025-01-23 MED ORDER — IPRATROPIUM-ALBUTEROL 0.5-2.5 (3) MG/3ML IN SOLN
3.0000 mL | Freq: Once | RESPIRATORY_TRACT | Status: AC
  Administered 2025-01-23: 3 mL via RESPIRATORY_TRACT

## 2025-01-23 NOTE — Discharge Instructions (Addendum)
 Your evaluation shows you have a viral infection of the upper airways of your lungs. Use the following medicines to help with your symptoms:  Take prednisone  steroid pills as prescribed.  Do not take ibuprofen /naproxen/other NSAIDs while taking steroid pills as this can cause upset stomach.   Albuterol  inhaler 1-2 puffs every 6 hours as needed for cough, shortness of breath, and wheezing.  Flonase , 2 sprays in each nostril once daily.  Benzonatate  every 8 hours as needed for cough.  Guaifenesin  (mucinex ) as needed for cough/nasal congestion.   If you develop any new or worsening symptoms or if your symptoms do not start to improve, please return here or follow-up with your primary care provider. If your symptoms are severe, please go to the emergency room.

## 2025-01-23 NOTE — ED Notes (Signed)
 Reviewed work note, states understanding of content

## 2025-01-23 NOTE — ED Provider Notes (Signed)
 " [facility]    CSN: [id number] Arrival date & time: 01/23/25  9180      History   Chief Complaint Chief Complaint  Patient presents with   Cough    HPI Rebecca Brewer is a 39 y.o. female.   This 39 year old female is being seen for complaints of bodyaches, rhinorrhea, nasal congestion, and nonproductive cough x 1 week.  Symptoms are associated with ear popping, decreased appetite, chest pain and shortness of breath with coughing.  She denies fever, chills, headache, dizziness, sore throat.  She denies abdominal pain, nausea, vomiting, diarrhea.  She has taking Coricidin and benzonatate  without significant relief of symptoms.   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 Authorizing Provider  albuterol  (VENTOLIN  HFA) 108 (90 Base) MCG/ACT inhaler Inhale 1-2 puffs into the lungs every 6 (six) hours as needed for wheezing or shortness of breath. 01/23/25  Yes Kenedee Molesky C, FNP  benzonatate  (TESSALON ) 100 MG capsule Take 1 capsule (100 mg total) by mouth every 8 (eight) hours as needed for cough. 01/23/25  Yes Braileigh Landenberger C, FNP  fluticasone  (FLONASE ) 50 MCG/ACT nasal spray Place 2  sprays into both nostrils daily. 01/23/25  Yes Sindia Kowalczyk C, FNP  predniSONE  (DELTASONE ) 20 MG tablet Take 2 tablets (40 mg total) by mouth daily with breakfast for 5 days. 01/23/25 01/28/25 Yes Jurrell Royster C, FNP  baclofen  (LIORESAL ) 10 MG tablet Take 1 tablet (10 mg total) by mouth 3 (three) times daily. 12/06/24   Enedelia Dorna HERO, FNP  dicyclomine  (BENTYL ) 20 MG tablet Take 1 tablet (20 mg total) by mouth 4 (four) times daily as needed (intestinal cramps). 07/17/24   Vonna Sharlet POUR, MD  FLUoxetine  (PROZAC ) 10 MG capsule Take 1 capsule (10 mg total) by mouth daily. 08/03/19   Agyei, Obed K, MD  ibuprofen  (ADVIL ) 600 MG tablet Take 1 tablet (600 mg total) by mouth every 8 (eight) hours as needed. 11/09/24   Raspet, Erin K, PA-C  levonorgestrel  (MIRENA ) 20 MCG/DAY IUD 1 each by Intrauterine route once.    [provider]  levothyroxine (SYNTHROID) 175 MCG tablet Take 175 mcg by mouth. 11/02/24 11/02/25  [provider]  Vitamin D, Ergocalciferol, (DRISDOL) 1.25 MG (50000 UNIT) CAPS capsule Take 50,000 Units by mouth once a week. 11/02/24 11/02/25  [provider]  Family History Family History  Problem Relation Age of Onset   Hypertension Mother    Diabetes Mother    Breast cancer Mother     Social History Social History[1]   Allergies   Patient has no known allergies.   Review of Systems Review of Systems  Constitutional:  Positive for activity change and appetite change. Negative for chills and fever.  HENT:  Positive for congestion and rhinorrhea. Negative for ear pain and sore throat.   Respiratory:  Positive for cough and shortness of breath.   Cardiovascular:  Positive for chest pain.  Gastrointestinal:  Negative for abdominal pain, diarrhea, nausea and vomiting.  Musculoskeletal:  Positive for myalgias.  Skin:  Negative for color change and rash.  Neurological:  Negative for dizziness and headaches.  All other systems reviewed and are  negative.    Physical Exam Triage Vital Signs ED Triage Vitals  Encounter Vitals Group     BP 01/23/25 0901 (!) 145/101     Girls Systolic BP Percentile --      Girls Diastolic BP Percentile --      Boys Systolic BP Percentile --      Boys Diastolic BP Percentile --      Pulse Rate 01/23/25 0901 78     Resp 01/23/25 0901 20     Temp 01/23/25 0901 97.9 F (36.6 C)     Temp Source 01/23/25 0901 Oral     SpO2 01/23/25 0901 97 %     Weight --      Height --      Head Circumference --      Peak Flow --      Pain Score 01/23/25 0859 0     Pain Loc --      Pain Education --      Exclude from Growth Chart --    No data found.  Updated Vital Signs BP (!) 148/85 (BP Location: Left Arm) Comment (BP Location): large cuff on forearm sizing better than on upper arm  Pulse 78   Temp 97.9 F (36.6 C) (Oral)   Resp 20   LMP 01/17/2025   SpO2 97%   Visual Acuity Right Eye Distance:   Left Eye Distance:   Bilateral Distance:    Right Eye Near:   Left Eye Near:    Bilateral Near:     Physical Exam Vitals and nursing note reviewed.  Constitutional:      General: She is not in acute distress.    Appearance: She is well-developed. She is not toxic-appearing.     Comments: Pleasant female appearing stated age found sitting in chair in no acute distress.  HENT:     Head: Normocephalic and atraumatic.     Right Ear: Tympanic membrane and external ear normal.     Left Ear: Tympanic membrane and external ear normal.     Nose: Congestion and rhinorrhea present.     Right Turbinates: Enlarged.     Left Turbinates: Enlarged.     Mouth/Throat:     Lips: Pink.     Mouth: Mucous membranes are moist.     Pharynx: No oropharyngeal exudate or posterior oropharyngeal erythema.  Eyes:     Conjunctiva/sclera: Conjunctivae normal.  Cardiovascular:     Rate and Rhythm: Normal rate and regular rhythm.     Heart sounds: Normal heart sounds. No murmur heard. Pulmonary:     Effort: Pulmonary  effort is normal. No respiratory distress.     Breath sounds:  Rhonchi present.  Abdominal:     General: Bowel sounds are normal.     Palpations: Abdomen is soft.     Tenderness: There is no abdominal tenderness.  Musculoskeletal:     Cervical back: Neck supple.  Skin:    General: Skin is warm and dry.     Capillary Refill: Capillary refill takes less than 2 seconds.  Neurological:     Mental Status: She is alert.  Psychiatric:        Mood and Affect: Mood normal.      UC Treatments / Results  Labs (all labs ordered are listed, but only abnormal results are displayed) Labs Reviewed - No data to display  EKG   Radiology DG Chest 2 View Result Date: 01/23/2025 EXAM: 2 VIEW(S) XRAY OF THE CHEST 01/23/2025 09:37:40 AM COMPARISON: None available. CLINICAL HISTORY: 39 year old female with runny nose, dry cough, yellow discharge from nose and intermittent aching for 1 week. FINDINGS: LUNGS AND PLEURA: No focal pulmonary opacity. No pleural effusion. No pneumothorax. HEART AND MEDIASTINUM: No acute abnormality of the cardiac and mediastinal silhouettes. BONES AND SOFT TISSUES: There are surgical clips projecting over the lower neck region. No acute osseous abnormality. IMPRESSION: 1. No acute process. Electronically signed by: Prentice Spade MD 01/23/2025 10:02 AM EST RP Workstation: [id number]    Procedures Procedures (including critical care time)  Medications Ordered in UC Medications  ipratropium-albuterol  (DUONEB) 0.5-2.5 (3) MG/3ML nebulizer solution 3 mL (3 mLs Nebulization Given 01/23/25 0946)    Initial Impression / Assessment and Plan / UC Course  I have reviewed the triage vital signs and the nursing notes.  Pertinent labs & imaging results that were available during my care of the patient were reviewed by me and considered in my medical decision making (see chart for details).     Vitals and triage reviewed, patient is hemodynamically stable.  Due to duration of  symptoms, COVID and flu swabs are deferred at this time.  Chest x-ray obtained, negative for acute findings.  She is given DuoNeb treatment in clinic.  Presentation consistent with acute bronchitis, viral respiratory infection.  She is given prescriptions for prednisone , albuterol  inhaler, benzonatate , Flonase .  Plan of care, follow-up care, return precautions given, no questions at this time.  She is provided a work note. Final Clinical Impressions(s) / UC Diagnoses   Final diagnoses:  Acute cough  Acute bronchitis, unspecified organism     Discharge Instructions      Your evaluation shows you have a viral infection of the upper airways of your lungs. Use the following medicines to help with your symptoms:  Take prednisone  steroid pills as prescribed.  Do not take ibuprofen /naproxen/other NSAIDs while taking steroid pills as this can cause upset stomach.   Albuterol  inhaler 1-2 puffs every 6 hours as needed for cough, shortness of breath, and wheezing.  Flonase , 2 sprays in each nostril once daily.  Benzonatate  every 8 hours as needed for cough.  Guaifenesin  (mucinex ) as needed for cough/nasal congestion.   If you develop any new or worsening symptoms or if your symptoms do not start to improve, please return here or follow-up with your primary care provider. If your symptoms are severe, please go to the emergency room.      ED Prescriptions     Medication Sig Dispense Auth. Provider   fluticasone  (FLONASE ) 50 MCG/ACT nasal spray Place 2 sprays into both nostrils daily. 9.9 mL Kristina Mcnorton C, FNP   predniSONE  (DELTASONE ) 20 MG tablet Take 2  tablets (40 mg total) by mouth daily with breakfast for 5 days. 10 tablet Aadan Chenier C, FNP   albuterol  (VENTOLIN  HFA) 108 (90 Base) MCG/ACT inhaler Inhale 1-2 puffs into the lungs every 6 (six) hours as needed for wheezing or shortness of breath. 1 each Jorden Minchey C, FNP   benzonatate  (TESSALON ) 100 MG capsule Take 1 capsule (100 mg  total) by mouth every 8 (eight) hours as needed for cough. 21 capsule Giah Fickett C, FNP      PDMP not reviewed this encounter.    [1]  Social History Tobacco Use   Smoking status: Never   Smokeless tobacco: Never  Vaping Use   Vaping status: Never Used  Substance Use Topics   Alcohol use: Yes    Alcohol/week: 0.0 standard drinks of alcohol    Comment: occasional   Drug use: No     Lennice Jon BROCKS, FNP 01/23/25 1021  "

## 2025-01-23 NOTE — ED Triage Notes (Signed)
 Symptoms started on week ago.  Patient has runny nose, cough, ( dry cough), yellow discharge from nose, intermittent aching  Same symptoms one month ago. Episodes of sob .  Ears pop when blowing nose  Has been taking OTC.  Patient does not think medicines taken from prior visit for similar symptoms did not work.   Coricidin and tessalon  not helping
